# Patient Record
Sex: Female | Born: 1985 | Race: White | Hispanic: No | Marital: Single | Smoking: Former smoker
Health system: Southern US, Community
[De-identification: ages and names within clinical notes are randomized; demographics above are authoritative.]

## PROBLEM LIST (undated history)

## (undated) DIAGNOSIS — A749 Chlamydial infection, unspecified: Secondary | ICD-10-CM

## (undated) DIAGNOSIS — I10 Essential (primary) hypertension: Secondary | ICD-10-CM

## (undated) DIAGNOSIS — E785 Hyperlipidemia, unspecified: Secondary | ICD-10-CM

## (undated) DIAGNOSIS — R7303 Prediabetes: Secondary | ICD-10-CM

## (undated) DIAGNOSIS — O1490 Unspecified pre-eclampsia, unspecified trimester: Secondary | ICD-10-CM

## (undated) HISTORY — DX: Unspecified pre-eclampsia, unspecified trimester: O14.90

## (undated) HISTORY — DX: Chlamydial infection, unspecified: A74.9

## (undated) HISTORY — DX: Hyperlipidemia, unspecified: E78.5

## (undated) HISTORY — DX: Prediabetes: R73.03

## (undated) HISTORY — PX: FIBULA FRACTURE SURGERY: SHX947

---
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
## 2009-05-12 ENCOUNTER — Ambulatory Visit (HOSPITAL_BASED_OUTPATIENT_CLINIC_OR_DEPARTMENT_OTHER): Admission: RE | Admit: 2009-05-12 | Discharge: 2009-05-12 | Payer: Self-pay | Admitting: Orthopedic Surgery

## 2009-07-15 ENCOUNTER — Encounter (HOSPITAL_COMMUNITY): Admission: RE | Admit: 2009-07-15 | Discharge: 2009-08-14 | Payer: Self-pay | Admitting: Orthopedic Surgery

## 2009-08-16 ENCOUNTER — Encounter (HOSPITAL_COMMUNITY): Admission: RE | Admit: 2009-08-16 | Discharge: 2009-09-15 | Payer: Self-pay | Admitting: Orthopedic Surgery

## 2010-02-16 ENCOUNTER — Emergency Department (HOSPITAL_COMMUNITY): Admission: EM | Admit: 2010-02-16 | Discharge: 2010-02-16 | Payer: Self-pay | Admitting: Family Medicine

## 2010-07-17 LAB — POCT I-STAT, CHEM 8
Calcium, Ion: 1.2 mmol/L (ref 1.12–1.32)
Glucose, Bld: 104 mg/dL — ABNORMAL HIGH (ref 70–99)
HCT: 43 % (ref 36.0–46.0)
Hemoglobin: 14.6 g/dL (ref 12.0–15.0)

## 2010-09-16 NOTE — Discharge Summary (Signed)
NAME:  Susan Valdez, Susan Valdez                             ACCOUNT NO.:  [id number]   MEDICAL RECORD NO.:  [id number]                   PATIENT TYPE:  INP   LOCATION:  9119                                 FACILITY:  [facility]   PHYSICIAN:  Bradly Bienenstock, M.D.                   DATE OF BIRTH:  [birth year]   DATE OF ADMISSION:  06/24/2003  DATE OF DISCHARGE:  06/27/2003                                 DISCHARGE SUMMARY   DISCHARGE DIAGNOSES:  1. Intrauterine pregnancy at term - delivered female infant.  2. Status post low transverse cesarean section secondary to breech position.  3. Anemia.  Discharge hemoglobin 9.3.  4. Pregnancy induced hypertension.  5. Oligohydramnios.   FOLLOW UP:  The patient is to follow up in six weeks at [facility].   DISCHARGE MEDICATIONS:  1. Ibuprofen 600 mg one q.6h. p.r.n.  2. Percocet 5/325 one to two q.4h. p.r.n.  3. Iron 325 mg one daily.  4. Micronor oral contraceptive one daily.   PROCEDURE:  Transverse cesarean section secondary to frank breech performed  by Dr. Okey Dupre on June 24, 2003.   HISTORY OF PRESENT ILLNESS:  A 25 year old G1 who presented at 37-4/[redacted] weeks  gestation dated by third trimester ultrasound, presented with frank breech  presentation.  The patient had elevated blood pressures during this  pregnancy and was found to have oligohydramnios on presentation.  Therefore,  secondary to this, the patient was admitted for primary cesarean section.   OBSTETRICAL AND GYNECOLOGICAL HISTORY:  Negative.   PAST MEDICAL HISTORY:  None.   PAST SURGICAL HISTORY:  None.   MEDICATIONS:  Prenatal vitamins.   ALLERGIES:  1. PENICILLIN  2. STRAWBERRIES.   LABORATORY DATA ON ADMISSION:  Blood type A positive, antibody screen  negative.  Hemoglobin 19.8.  Platelets 279, rubella immune.  Hepatitis  negative.  Syphilis noncontributory.  HIV negative.  GC and Chlamydia  negative.  GBS negative on June 02, 2003.   PHYSICAL EXAMINATION:  GENERAL  APPEARANCE:  No acute distress.  VITAL SIGNS:  On presentation, temperature 98.9, pulse 81, respiratory rate  18, blood pressure 143/81.  The rest of the physical examination was within normal limits.  Fetal heart  rate was in the 130s with good variability, no decelerations.  The patient  was not having any contractions.   ASSESSMENT:  A 24 year old G1 with a frank breech with elevated blood  pressures and oligohydramnios believed secondary to preeclampsia.  She was  scheduled for a cesarean section.   HOSPITAL COURSE:  A cesarean section was performed on June 24, 2003.  For full operative report, please see that dictation.  She gave birth to a  female with Apgars 8 and 9.  The patient, after the cesarean section, had  routine postpartum care.  Blood pressures stayed in the normal range and she  did not exhibit any further signs of preeclampsia.  Therefore, on  postoperative day #3, se was discharged to home.  Hemoglobin on discharge  was 9.3.                                               Bradly Bienenstock, M.D.    Susan Valdez  D:  09/01/2003  T:  09/02/2003  Job:  [id number]

## 2010-09-16 NOTE — Op Note (Signed)
NAME:  Susan Valdez, Susan Valdez                             ACCOUNT NO.:  [id number]   MEDICAL RECORD NO.:  [id number]                   PATIENT TYPE:  INP   LOCATION:  9119                                 FACILITY:  [facility]   PHYSICIAN:  Phil D. Okey Dupre, M.D.                  DATE OF BIRTH:  December 14, 1985   DATE OF PROCEDURE:  06/24/2003  DATE OF DISCHARGE:                                 OPERATIVE REPORT   PROCEDURE:  Low transverse cesarean section.   INDICATIONS:  Homero Fellers breech.   SURGEON:  Javier Glazier. Okey Dupre, M.D.   FIRST ASSISTANT:  Bradly Bienenstock, M.D.   ESTIMATED BLOOD LOSS:  1.   Infant, a female with Apgars of 8 and 9, cord gas of 7.32.   PROCEDURE NOTE:  Informed consent for procedure was obtained from patient.  The patient had spinal anesthesia performed in the operating room and was  placed in the normal operating position.  A Foley catheter was inserted.  The patient's abdomen was prepped with a sterile iodine solution, and she  was draped in the normal fashion.  Adequate anesthesia was verified with  Allis clamps and the procedure was begun.  A Pfannenstiel skin incision was  made using a scalpel and subcutaneous tissue was sharply dissected down to  the fascia.  Fascia was incised with a scalpel and then sharply opened with  Mayo scissors laterally to the width of the skin incision.  The rectus  muscles were dissected away from the fascia, first bluntly and then sharply,  using Mayo scissors.  Rectus muscles were separated and then peritoneum was  identified and the peritoneum was identified using hemostats and was opened  sharply using Metzenbaum scissors.  Rectus and peritoneum were stretched  open bluntly.  A bladder blade was inserted and the uterus was located.  A  bladder flap was created sharply using Metzenbaum scissors.  Bladder flap  was dissected bluntly away from the vesicouterine peritoneum and bladder  blade was placed inside the bladder flap.  Uterus was opened sharply with  scalpel and then stretched open laterally.  The uterus was entered to  retrieve infant and infant was delivered in a frank breech position.  Infant  was bulb-suctioned at the peritoneum.  Infant was delivered at 12:02, a  female with Apgars 8 and 9, cord gas 7.32.  After infant was bulb-suctioned,  cord was clamped and cut and infant was handed to waiting NICU team.  Arterial blood gas was obtained as well as venous cord gas.  Placenta was  delivered immediately and delivered spontaneously.  The uterus was massaged  and then a dry lap was used to check the uterine cavity for any residual  placental tissue.  After this had been verified, the edges of the uterine  incision were identified with ring forceps, the bladder blade was  reinserted.  Chromic 0 suture was used in  a running locking fashion to close  the uterine incision.  The uterus was then irrigated with sterile saline  solution to verify good hemostasis.  After adequate hemostasis was verified,  fascial layer was closed in a running fashion with 0 chromic suture.  Subcutaneous tissues were irrigated and adequate hemostasis was verified and  the skin incision was closed with staples.  The patient was in stable  condition and doing well after procedure.    Bradly Bienenstock, M.D.                         Phil D. Okey Dupre, M.D.   Arliss Journey  D:  06/24/2003  T:  06/24/2003  Job:  [id number]

## 2010-09-26 ENCOUNTER — Emergency Department (HOSPITAL_COMMUNITY)
Admission: EM | Admit: 2010-09-26 | Discharge: 2010-09-26 | Disposition: A | Discharge status: Home / Self Care | Payer: Self-pay | Attending: Emergency Medicine | Admitting: Emergency Medicine

## 2010-09-26 ENCOUNTER — Emergency Department (HOSPITAL_COMMUNITY): Payer: Self-pay

## 2010-09-26 DIAGNOSIS — I10 Essential (primary) hypertension: Secondary | ICD-10-CM | POA: Insufficient documentation

## 2010-09-26 DIAGNOSIS — M25579 Pain in unspecified ankle and joints of unspecified foot: Secondary | ICD-10-CM | POA: Insufficient documentation

## 2012-03-11 ENCOUNTER — Emergency Department (HOSPITAL_COMMUNITY)
Admission: EM | Admit: 2012-03-11 | Discharge: 2012-03-11 | Disposition: A | Discharge status: Home / Self Care | Payer: Medicaid Other | Attending: Emergency Medicine | Admitting: Emergency Medicine

## 2012-03-11 ENCOUNTER — Encounter (HOSPITAL_COMMUNITY): Payer: Self-pay | Admitting: Emergency Medicine

## 2012-03-11 DIAGNOSIS — H5789 Other specified disorders of eye and adnexa: Secondary | ICD-10-CM | POA: Insufficient documentation

## 2012-03-11 DIAGNOSIS — I10 Essential (primary) hypertension: Secondary | ICD-10-CM | POA: Insufficient documentation

## 2012-03-11 DIAGNOSIS — H109 Unspecified conjunctivitis: Secondary | ICD-10-CM

## 2012-03-11 HISTORY — DX: Essential (primary) hypertension: I10

## 2012-03-11 MED ORDER — TOBRAMYCIN 0.3 % OP SOLN
2.0000 [drp] | OPHTHALMIC | Status: DC
  Administered 2012-03-11: 2 [drp] via OPHTHALMIC
  Filled 2012-03-11: qty 5

## 2012-03-11 MED ORDER — TOBRAMYCIN-DEXAMETHASONE 0.3-0.1 % OP SUSP: 1.0000 [drp] | OPHTHALMIC | Status: DC

## 2012-03-11 NOTE — ED Notes (Signed)
Cold like symptoms prior to getting symptoms in eyes. Drainage and redness noted bilaterally.

## 2012-03-11 NOTE — ED Provider Notes (Signed)
History   This chart was scribed for Donnetta Hutching, MD by Charolett Bumpers, ER Scribe. The patient was seen in room APA07/APA07. Patient's care was started at 0731.   CSN: [id number]  Arrival date & time 03/11/12  0711   First MD Initiated Contact with Patient 03/11/12 [phone]      Chief Complaint  Patient presents with  . Conjunctivitis    The history is provided by the patient. No language interpreter was used.  Susan Valdez is a 26 y.o. female who presents to the Emergency Department complaining of moderate bilateral eye redness with associated swelling for the past 2 days. She states that her symptoms started with the right eye 2 days ago that spread to left eye later that day. She reports associated cough, sneezing and sore throat that proceeded the eye symptoms. She reports relief with warm compresses. She denies any sick contacts. She is otherwise normally healthy.   Past Medical History  Diagnosis Date  . Hypertension     Past Surgical History  Procedure Date  . Fibula fracture surgery   . Cesarean section     History reviewed. No pertinent family history.  History  Substance Use Topics  . Smoking status: Never Smoker   . Smokeless tobacco: Not on file  . Alcohol Use: No    OB History    Grav Para Term Preterm Abortions TAB SAB Ect Mult Living                  Review of Systems A complete 10 system review of systems was obtained and all systems are negative except as noted in the HPI and PMH.   Allergies  Penicillins  Home Medications  No current outpatient prescriptions on file.  BP 129/87  Pulse 93  Temp 98.9 F (37.2 C) (Oral)  Resp 18  Ht 5\' 3"  (1.6 m)  Wt 165 lb (74.844 kg)  BMI 29.23 kg/m2  SpO2 100%  LMP 02/22/2012  Physical Exam  Nursing note and vitals reviewed. Constitutional: She is oriented to person, place, and time. She appears well-developed and well-nourished. No distress.  HENT:  Head: Normocephalic and atraumatic.  Right  Ear: External ear normal.  Left Ear: External ear normal.  Nose: Nose normal.  Mouth/Throat: Oropharynx is clear and moist. No oropharyngeal exudate.  Eyes: EOM are normal. Pupils are equal, round, and reactive to light. Right eye exhibits discharge. Left eye exhibits discharge. Right conjunctiva is injected. Left conjunctiva is injected.       Bilateral clear discharge.   Cardiovascular: Normal rate.   Pulmonary/Chest: Effort normal. No respiratory distress.  Musculoskeletal: Normal range of motion.  Neurological: She is alert and oriented to person, place, and time.  Skin: Skin is warm and dry.  Psychiatric: She has a normal mood and affect. Her behavior is normal.    ED Course  Procedures (including critical care time)  DIAGNOSTIC STUDIES: Oxygen Saturation is 100% on room air, normal by my interpretation.    COORDINATION OF CARE:  07:47-Discussed planned course of treatment with the patient including Tobramycin eye drops, who is agreeable at this time.   08:00-Medication Orders: Tobramycin (Tobrex) 0.3% ophthalmic solution 2 drop- 6 times per day.   Labs Reviewed - No data to display No results found.   No diagnosis found.    MDM  History and physical consistent with conjunctivitis. Rx tobramycin ophthalmic   I personally performed the services described in this documentation, which was scribed in my presence.  The recorded information has been reviewed and is accurate.       Donnetta Hutching, MD 03/11/12 [phone]

## 2012-03-15 ENCOUNTER — Encounter (HOSPITAL_COMMUNITY): Payer: Self-pay | Admitting: Emergency Medicine

## 2012-03-15 ENCOUNTER — Emergency Department (HOSPITAL_COMMUNITY)
Admission: EM | Admit: 2012-03-15 | Discharge: 2012-03-15 | Disposition: A | Discharge status: Home / Self Care | Payer: Medicaid Other | Attending: Emergency Medicine | Admitting: Emergency Medicine

## 2012-03-15 DIAGNOSIS — I10 Essential (primary) hypertension: Secondary | ICD-10-CM | POA: Insufficient documentation

## 2012-03-15 DIAGNOSIS — H5789 Other specified disorders of eye and adnexa: Secondary | ICD-10-CM | POA: Insufficient documentation

## 2012-03-15 DIAGNOSIS — H109 Unspecified conjunctivitis: Secondary | ICD-10-CM | POA: Insufficient documentation

## 2012-03-15 MED ORDER — SULFACETAMIDE SODIUM 10 % OP SOLN: 2.0000 [drp] | OPHTHALMIC | Status: DC

## 2012-03-15 NOTE — ED Notes (Signed)
Pt states was seen on Monday for same. Has used all of antibiotic drops and no better.

## 2012-03-15 NOTE — ED Provider Notes (Signed)
History   This chart was scribed for Susan Lyons, MD by Toya Smothers, ED Scribe. The patient was seen in room APA18/APA18. Patient's care was started at 0810.  CSN: [id number]  Arrival date & time 03/15/12  0810   First MD Initiated Contact with Patient 03/15/12 [phone]      Chief Complaint  Patient presents with  . Conjunctivitis    Patient is a 26 y.o. female presenting with conjunctivitis. The history is provided by the patient. No language interpreter was used.  Conjunctivitis  The current episode started 5 to 7 days ago. The onset was sudden. The problem occurs continuously. The problem has been unchanged. The problem is moderate. Nothing relieves the symptoms. Nothing aggravates the symptoms. Associated symptoms include eye discharge and eye redness. Pertinent negatives include no fever, no abdominal pain, no diarrhea, no nausea, no vomiting and no rash. The eye pain is moderate. Both eyes are affected.The eye pain is not associated with movement. The eyelid exhibits redness. She has been behaving normally. She has been eating and drinking normally. Urine output has been normal. There were no sick contacts. Recently, medical care has been given at this facility. Services received include medications given and tests performed.   Susan Valdez is a 26 y.o. female who presents to the Emergency Department complaining of 6 days of recurrent, constant, unchanged, moderate bilateral eye redness with associated yellowish discharge and itching. Pt was evaluated at [facility] ED 4 days ago, Dx with conjunctivitis and Rx with Tobradex eye drop solution. Pt reports no improvement or relief despite the use of RX. No fever, chills, cough, congestion, rhinorrhea, chest pain, SOB, or n/v/d. Medical Hx includes HTN.     Past Medical History  Diagnosis Date  . Hypertension     Past Surgical History  Procedure Date  . Fibula fracture surgery   . Cesarean section     History reviewed. No pertinent  family history.  History  Substance Use Topics  . Smoking status: Never Smoker   . Smokeless tobacco: Not on file  . Alcohol Use: No    Review of Systems  Constitutional: Negative for fever.  Eyes: Positive for discharge and redness.  Gastrointestinal: Negative for nausea, vomiting, abdominal pain and diarrhea.  Skin: Negative for rash.  All other systems reviewed and are negative.    Allergies  Penicillins  Home Medications   Current Outpatient Rx  Name  Route  Sig  Dispense  Refill  . TOBRAMYCIN-DEXAMETHASONE 0.3-0.1 % OP SUSP   Both Eyes   Place 1 drop into both eyes every 4 (four) hours while awake.   10 mL   0     BP 124/77  Pulse 99  Temp 98.7 F (37.1 C) (Oral)  Resp 18  Ht 5\' 3"  (1.6 m)  Wt 165 lb (74.844 kg)  BMI 29.23 kg/m2  SpO2 98%  LMP 02/22/2012  Physical Exam  Nursing note and vitals reviewed. Constitutional: She is oriented to person, place, and time. She appears well-developed and well-nourished. No distress.  HENT:  Head: Normocephalic.  Mouth/Throat: No oropharyngeal exudate.  Eyes: EOM are normal. Pupils are equal, round, and reactive to light.       Moderate injection of the conjunctiva bilaterally with purulent drainage. Crusting in the eye-lashes  Neck: Normal range of motion. No tracheal deviation present.  Cardiovascular: Normal rate, regular rhythm and normal heart sounds.   No murmur heard. Pulmonary/Chest: Effort normal and breath sounds normal. No respiratory distress. She has  no wheezes.  Musculoskeletal: Normal range of motion. She exhibits no edema.  Lymphadenopathy:    She has no cervical adenopathy.  Neurological: She is alert and oriented to person, place, and time. Coordination normal.  Skin: Skin is warm and dry. No rash noted. She is not diaphoretic.  Psychiatric: Her behavior is normal.    ED Course  Procedures DIAGNOSTIC STUDIES: Oxygen Saturation is 98% on room air, normal by my interpretation.     COORDINATION OF CARE: 08:30- Evaluated Pt. Pt is awake, alert, and without distress. 08:35- Patient  understand and agree with initial ED impression and plan with expectations set for ED visit.   Labs Reviewed - No data to display No results found.   No diagnosis found.    MDM  Not improving with meds given.  Will change to Bleph 10.  Return prn.      I personally performed the services described in this documentation, which was scribed in my presence. The recorded information has been reviewed and is accurate.      Susan Lyons, MD 03/15/12 1540

## 2012-09-07 ENCOUNTER — Encounter (HOSPITAL_COMMUNITY): Payer: Self-pay | Admitting: *Deleted

## 2012-09-07 ENCOUNTER — Emergency Department (HOSPITAL_COMMUNITY): Payer: Self-pay

## 2012-09-07 ENCOUNTER — Emergency Department (HOSPITAL_COMMUNITY)
Admission: EM | Admit: 2012-09-07 | Discharge: 2012-09-07 | Disposition: A | Discharge status: Home / Self Care | Payer: Self-pay | Attending: Emergency Medicine | Admitting: Emergency Medicine

## 2012-09-07 DIAGNOSIS — Z79899 Other long term (current) drug therapy: Secondary | ICD-10-CM | POA: Insufficient documentation

## 2012-09-07 DIAGNOSIS — S90129A Contusion of unspecified lesser toe(s) without damage to nail, initial encounter: Secondary | ICD-10-CM | POA: Insufficient documentation

## 2012-09-07 DIAGNOSIS — Y939 Activity, unspecified: Secondary | ICD-10-CM | POA: Insufficient documentation

## 2012-09-07 DIAGNOSIS — I10 Essential (primary) hypertension: Secondary | ICD-10-CM | POA: Insufficient documentation

## 2012-09-07 DIAGNOSIS — S90121A Contusion of right lesser toe(s) without damage to nail, initial encounter: Secondary | ICD-10-CM

## 2012-09-07 DIAGNOSIS — W010XXA Fall on same level from slipping, tripping and stumbling without subsequent striking against object, initial encounter: Secondary | ICD-10-CM | POA: Insufficient documentation

## 2012-09-07 DIAGNOSIS — Z880 Allergy status to penicillin: Secondary | ICD-10-CM | POA: Insufficient documentation

## 2012-09-07 DIAGNOSIS — IMO0002 Reserved for concepts with insufficient information to code with codable children: Secondary | ICD-10-CM | POA: Insufficient documentation

## 2012-09-07 DIAGNOSIS — Y929 Unspecified place or not applicable: Secondary | ICD-10-CM | POA: Insufficient documentation

## 2012-09-07 MED ORDER — IBUPROFEN 800 MG PO TABS
800.0000 mg | ORAL_TABLET | Freq: Once | ORAL | Status: AC
  Administered 2012-09-07: 800 mg via ORAL
  Filled 2012-09-07: qty 1

## 2012-09-07 MED ORDER — IBUPROFEN 600 MG PO TABS: 600.0000 mg | ORAL_TABLET | Freq: Four times a day (QID) | ORAL | Status: DC | PRN

## 2012-09-07 NOTE — ED Provider Notes (Signed)
Medical screening examination/treatment/procedure(s) were performed by non-physician practitioner and as supervising physician I was immediately available for consultation/collaboration.  Gilda Crease, MD 09/07/12 [phone]

## 2012-09-07 NOTE — ED Provider Notes (Signed)
History     CSN: [id number]  Arrival date & time 09/07/12  1527   First MD Initiated Contact with Patient 09/07/12 1603      Chief Complaint  Patient presents with  . Toe Pain    (Consider location/radiation/quality/duration/timing/severity/associated sxs/prior treatment) HPI Comments: Susan Valdez is a 27 y.o. Female with right great toe pain after stubbing while tripping and falling this morning.  She reports constant aching pain which does not radiate.  She denies any other injury.  She has used an ice pack for relief of pain.  Pain is worsened with movement, weight bearing and palpation.     The history is provided by the patient.    Past Medical History  Diagnosis Date  . Hypertension     Past Surgical History  Procedure Laterality Date  . Fibula fracture surgery    . Cesarean section      No family history on file.  History  Substance Use Topics  . Smoking status: Never Smoker   . Smokeless tobacco: Not on file  . Alcohol Use: No    OB History   Grav Para Term Preterm Abortions TAB SAB Ect Mult Living                  Review of Systems  Constitutional: Negative for fever.  Musculoskeletal: Positive for arthralgias. Negative for myalgias and joint swelling.  Neurological: Negative for weakness and numbness.    Allergies  Penicillins  Home Medications   Current Outpatient Rx  Name  Route  Sig  Dispense  Refill  . hydrochlorothiazide (HYDRODIURIL) 25 MG tablet   Oral   Take 25 mg by mouth daily.         Marland Kitchen ibuprofen (ADVIL,MOTRIN) 600 MG tablet   Oral   Take 1 tablet (600 mg total) by mouth every 6 (six) hours as needed for pain.   30 tablet   0     BP 135/91  Pulse 102  Temp(Src) 97.9 F (36.6 C) (Oral)  Resp 16  Ht 5\' 3"  (1.6 m)  Wt 180 lb (81.647 kg)  BMI 31.89 kg/m2  SpO2 100%  LMP 08/31/2012  Physical Exam  Constitutional: She appears well-developed and well-nourished.  HENT:  Head: Atraumatic.  Neck: Normal range of  motion.  Cardiovascular:  Pulses equal bilaterally  Musculoskeletal: She exhibits tenderness.       Right foot: She exhibits bony tenderness. She exhibits normal capillary refill and no deformity.  ttp dorsal proximal phalanx of right great toe.  Mild erythema and edema. Cap refill less than 3 sec.  No deformity.  Distal sensation intact.  Neurological: She is alert. She has normal strength. She displays normal reflexes. No sensory deficit.  Equal strength  Skin: Skin is warm and dry.  Psychiatric: She has a normal mood and affect.    ED Course  Procedures (including critical care time)  Labs Reviewed - No data to display Dg Toe Great Right  09/07/2012  *RADIOLOGY REPORT*  Clinical Data: Fall.  Great toe pain.  RIGHT GREAT TOE  Comparison: None.  Findings: Right great toe appears within normal limits.  There is anatomic alignment.  No fracture.  No radiopaque foreign body.  IMPRESSION: Negative.   Original Report Authenticated By: Andreas Newport, M.D.      1. Toe contusion, right, initial encounter       MDM  Patients labs and/or radiological studies were viewed and considered during the medical decision making and disposition process.  Pt supplied post op shoe for comfort.  Encouraged continued ice and elevation,  Ibuprofen prn pain.          Burgess Amor, PA-C 09/07/12 1629

## 2012-09-07 NOTE — ED Notes (Signed)
Pain to right great toe. Began after tripping and falling on concrete.

## 2014-12-31 LAB — HM PAP SMEAR: HM Pap smear: NEGATIVE

## 2015-01-26 ENCOUNTER — Encounter: Payer: Self-pay | Admitting: *Deleted

## 2015-02-02 ENCOUNTER — Encounter: Payer: Self-pay | Admitting: Obstetrics & Gynecology

## 2015-08-06 ENCOUNTER — Encounter (HOSPITAL_COMMUNITY): Payer: Self-pay | Admitting: Emergency Medicine

## 2015-08-06 ENCOUNTER — Emergency Department (HOSPITAL_COMMUNITY)
Admission: EM | Admit: 2015-08-06 | Discharge: 2015-08-06 | Disposition: A | Discharge status: Home / Self Care | Payer: Medicaid Other | Attending: Emergency Medicine | Admitting: Emergency Medicine

## 2015-08-06 DIAGNOSIS — I10 Essential (primary) hypertension: Secondary | ICD-10-CM | POA: Insufficient documentation

## 2015-08-06 DIAGNOSIS — J029 Acute pharyngitis, unspecified: Secondary | ICD-10-CM | POA: Insufficient documentation

## 2015-08-06 DIAGNOSIS — R52 Pain, unspecified: Secondary | ICD-10-CM | POA: Insufficient documentation

## 2015-08-06 LAB — RAPID STREP SCREEN (MED CTR MEBANE ONLY): Streptococcus, Group A Screen (Direct): NEGATIVE

## 2015-08-06 NOTE — Discharge Instructions (Signed)
Take over the counter tylenol and ibuprofen, as directed on packaging, as needed for discomfort.  Gargle with warm water several times per day to help with discomfort.  May also use over the counter sore throat pain medicines such as chloraseptic or sucrets, as directed on packaging, as needed for discomfort.  Call your regular medical doctor today to schedule a follow up appointment in the next 3 days.  Return to the Emergency Department immediately if worsening.

## 2015-08-06 NOTE — ED Notes (Signed)
PT c/o sorethroat, bilateral ear pain, and body aches x4 days.

## 2015-08-06 NOTE — ED Provider Notes (Signed)
CSN: [id number]     Arrival date & time 08/06/15  0746 History   First MD Initiated Contact with Patient 08/06/15 [phone]     Chief Complaint  Patient presents with  . Sore Throat      HPI  Pt was seen at 0800.  Per pt, c/o gradual onset and persistence of constant sore throat, bilat ears "pain" and generalized body aches for the past 3 days.  Denies fevers, no rash, no CP/SOB, no N/V/D, no abd pain.    Past Medical History  Diagnosis Date  . Hypertension    Past Surgical History  Procedure Laterality Date  . Fibula fracture surgery    . Cesarean section      Social History  Substance Use Topics  . Smoking status: Never Smoker   . Smokeless tobacco: None  . Alcohol Use: No   OB History    Gravida Para Term Preterm AB TAB SAB Ectopic Multiple Living   1 1        1      Review of Systems ROS: Statement: All systems negative except as marked or noted in the HPI; Constitutional: Negative for fever and chills. +generalized body aches.; ; Eyes: Negative for eye pain, redness and discharge. ; ; ENMT: Negative for hoarseness, nasal congestion, sinus pressure and +sore throat, bilat ears pain. ; ; Cardiovascular: Negative for chest pain, palpitations, diaphoresis, dyspnea and peripheral edema. ; ; Respiratory: Negative for cough, wheezing and stridor. ; ; Gastrointestinal: Negative for nausea, vomiting, diarrhea, abdominal pain, blood in stool, hematemesis, jaundice and rectal bleeding. . ; ; Genitourinary: Negative for dysuria, flank pain and hematuria. ; ; Musculoskeletal: Negative for back pain and neck pain. Negative for swelling and trauma.; ; Skin: Negative for pruritus, rash, abrasions, blisters, bruising and skin lesion.; ; Neuro: Negative for headache, lightheadedness and neck stiffness. Negative for weakness, altered level of consciousness , altered mental status, extremity weakness, paresthesias, involuntary movement, seizure and syncope.       Allergies  Penicillins  Home  Medications   Prior to Admission medications   Medication Sig Start Date End Date Taking? Authorizing Provider  hydrochlorothiazide (HYDRODIURIL) 25 MG tablet Take 25 mg by mouth daily.    Historical Provider, MD  ibuprofen (ADVIL,MOTRIN) 600 MG tablet Take 1 tablet (600 mg total) by mouth every 6 (six) hours as needed for pain. 09/07/12   Burgess AmorJulie Idol, PA-C  norethindrone (MICRONOR,CAMILA,ERRIN) 0.35 MG tablet Take 1 tablet by mouth daily.    Historical Provider, MD   BP 146/89 mmHg  Pulse 95  Temp(Src) 97.8 F (36.6 C) (Oral)  Resp 18  Ht 5\' 4"  (1.626 m)  Wt 174 lb (78.926 kg)  BMI 29.85 kg/m2  SpO2 96%  LMP 07/10/2015 Physical Exam  0805: Physical examination:  Nursing notes reviewed; Vital signs and O2 SAT reviewed;  Constitutional: Well developed, Well nourished, Well hydrated, In no acute distress; Head:  Normocephalic, atraumatic; Eyes: EOMI, PERRL, No scleral icterus; ENMT: TM's clear bilat. +edemetous nasal turbinates bilat with clear rhinorrhea. Mouth and pharynx without lesions. +mild posterior pharyngeal erythema. No tonsillar exudates. No intra-oral edema. No submandibular or sublingual edema. No hoarse voice, no drooling, no stridor. No pain with manipulation of larynx. No trismus. Mouth and pharynx normal, Mucous membranes moist; Neck: Supple, Full range of motion, No lymphadenopathy; Cardiovascular: Regular rate and rhythm, No murmur, rub, or gallop; Respiratory: Breath sounds clear & equal bilaterally, No rales, rhonchi, wheezes.  Speaking full sentences with ease, Normal respiratory effort/excursion; Chest:  Nontender, Movement normal; Abdomen: Soft, Nontender, Nondistended, Normal bowel sounds; Genitourinary: No CVA tenderness; Extremities: Pulses normal, No tenderness, No edema, No calf edema or asymmetry.; Neuro: AA&Ox3, Major CN grossly intact.  Speech clear. No gross focal motor or sensory deficits in extremities.; Skin: Color normal, Warm, Dry.   ED Course  Procedures  (including critical care time) Labs Review  Imaging Review  I have personally reviewed and evaluated these images and lab results as part of my medical decision-making.   EKG Interpretation None      MDM  MDM Reviewed: previous chart, nursing note and vitals Interpretation: labs     Results for orders placed or performed during the hospital encounter of 08/06/15  Rapid strep screen  Result Value Ref Range   Streptococcus, Group A Screen (Direct) NEGATIVE NEGATIVE    0845:  Pt states she "came here to just see if I had strep." Strep negative; pt reassured. Pt wants to go home now. Tx symptomatically. Dx and testing d/w pt.  Questions answered.  Verb understanding, agreeable to d/c home with outpt f/u.     Samuel Jester, DO 08/09/15 [phone]

## 2015-08-08 LAB — CULTURE, GROUP A STREP (THRC)

## 2016-02-28 ENCOUNTER — Encounter: Payer: Self-pay | Admitting: *Deleted

## 2016-02-29 ENCOUNTER — Ambulatory Visit (INDEPENDENT_AMBULATORY_CARE_PROVIDER_SITE_OTHER): Payer: Medicaid Other | Admitting: Obstetrics & Gynecology

## 2016-02-29 ENCOUNTER — Encounter: Payer: Self-pay | Admitting: Obstetrics & Gynecology

## 2016-02-29 VITALS — BP 120/90 | HR 88 | Ht 63.0 in | Wt 178.0 lb

## 2016-02-29 DIAGNOSIS — Z3009 Encounter for other general counseling and advice on contraception: Secondary | ICD-10-CM | POA: Diagnosis not present

## 2016-02-29 NOTE — Progress Notes (Signed)
Chief Complaint  Patient presents with  . Referral    discuss possible surgery/ BTL    Blood pressure 120/90, pulse 88, height 5\' 3"  (1.6 m), weight 178 lb (80.7 kg), last menstrual period 02/25/2016.  30 y.o. G1P1 Patient's last menstrual period was 02/25/2016. The current method of family planning is oral progesterone-only contraceptive.  Outpatient Encounter Prescriptions as of 02/29/2016  Medication Sig  . hydrochlorothiazide (HYDRODIURIL) 25 MG tablet Take 25 mg by mouth daily.  . norethindrone (MICRONOR,CAMILA,ERRIN) 0.35 MG tablet Take 1 tablet by mouth daily.  . metroNIDAZOLE (FLAGYL) 500 MG tablet Take 500 mg by mouth 2 (two) times daily.  . [DISCONTINUED] fluconazole (DIFLUCAN) 150 MG tablet Take 150 mg by mouth as directed.  . [DISCONTINUED] nystatin cream (MYCOSTATIN) Apply 1 application topically 2 (two) times daily as needed for dry skin.  . [DISCONTINUED] Omega-3 Fatty Acids (FISH OIL) 1000 MG CAPS Take by mouth 2 (two) times daily.   No facility-administered encounter medications on file as of 02/29/2016.     Subjective Pt G1P1 Patient's last menstrual period was 02/25/2016. on micronor desires permanent sterilization We discussed mirena nexplanon depo provera OCP She definitely wants permanent sterilization In a new relationship he has 1 child   Objective   Pertinent ROS   Labs or studies     Impression Diagnoses this Encounter::   ICD-9-CM ICD-10-CM   1. Sterilization consult V25.09 Z30.09     Established relevant diagnosis(es):   Plan/Recommendations: No orders of the defined types were placed in this encounter.   Labs or Scans Ordered: No orders of the defined types were placed in this encounter.   Management:: Sign BTL papers today, return in 1 month to schedule  Follow up Return in about 4 weeks (around 03/28/2016) for pre op , with Dr Despina HiddenEure.        Face to face time:  20 minutes  Greater than 50% of the visit time  was spent in counseling and coordination of care with the patient.  The summary and outline of the counseling and care coordination is summarized in the note above.   All questions were answered.  Past Medical History:  Diagnosis Date  . Chlamydia   . Hypertension   . Increased serum lipids   . Pre-diabetes   . Toxemia in pregnancy    resolved    Past Surgical History:  Procedure Laterality Date  . CESAREAN SECTION    . FIBULA FRACTURE SURGERY      OB History    Gravida Para Term Preterm AB Living   1 1       1    SAB TAB Ectopic Multiple Live Births                  Allergies  Allergen Reactions  . Penicillins     Social History   Social History  . Marital status: Single    Spouse name: N/A  . Number of children: N/A  . Years of education: N/A   Social History Main Topics  . Smoking status: Never Smoker  . Smokeless tobacco: Never Used  . Alcohol use Yes     Comment: once a month  . Drug use: No  . Sexual activity: Not Asked   Other Topics Concern  . None   Social History Narrative  . None    Family History  Problem Relation Age of Onset  . Hypertension Mother   . Diabetes Mother   . Gestational  diabetes Other

## 2016-03-28 ENCOUNTER — Encounter: Payer: Self-pay | Admitting: Obstetrics & Gynecology

## 2016-03-28 ENCOUNTER — Ambulatory Visit (INDEPENDENT_AMBULATORY_CARE_PROVIDER_SITE_OTHER): Payer: Medicaid Other | Admitting: Obstetrics & Gynecology

## 2016-03-28 VITALS — BP 134/88 | HR 85 | Ht 62.0 in | Wt 175.6 lb

## 2016-03-28 DIAGNOSIS — Z308 Encounter for other contraceptive management: Secondary | ICD-10-CM | POA: Diagnosis not present

## 2016-03-28 DIAGNOSIS — Z3009 Encounter for other general counseling and advice on contraception: Secondary | ICD-10-CM | POA: Diagnosis not present

## 2016-03-28 DIAGNOSIS — Z01818 Encounter for other preprocedural examination: Secondary | ICD-10-CM

## 2016-03-28 NOTE — Progress Notes (Signed)
G1P1  Preoperative History and Physical  Susan Valdez is a 30 y.o. G1P1 with Patient's last menstrual period was 02/25/2016. admitted for a laparoscopic bilateral salpingectomy for sterilization.  Discussed at length and had pt wait for 1 month, she definitely wants no more kids, choosed salpingectomy bilaterally for ovarian cancer prophylaxis  PMH:    Past Medical History:  Diagnosis Date  . Chlamydia   . Hypertension   . Increased serum lipids   . Pre-diabetes   . Toxemia in pregnancy    resolved    PSH:     Past Surgical History:  Procedure Laterality Date  . CESAREAN SECTION    . FIBULA FRACTURE SURGERY      POb/GynH:      OB History    Gravida Para Term Preterm AB Living   1 1       1    SAB TAB Ectopic Multiple Live Births                  SH:   Social History  Substance Use Topics  . Smoking status: Never Smoker  . Smokeless tobacco: Never Used  . Alcohol use Yes     Comment: once a month    FH:    Family History  Problem Relation Age of Onset  . Hypertension Mother   . Diabetes Mother   . Gestational diabetes Other      Allergies:  Allergies  Allergen Reactions  . Penicillins     Medications:       Current Outpatient Prescriptions:  .  hydrochlorothiazide (HYDRODIURIL) 25 MG tablet, Take 25 mg by mouth daily., Disp: , Rfl:  .  norethindrone (MICRONOR,CAMILA,ERRIN) 0.35 MG tablet, Take 1 tablet by mouth daily., Disp: , Rfl:   Review of Systems:   Review of Systems  Constitutional: Negative for fever, chills, weight loss, malaise/fatigue and diaphoresis.  HENT: Negative for hearing loss, ear pain, nosebleeds, congestion, sore throat, neck pain, tinnitus and ear discharge.   Eyes: Negative for blurred vision, double vision, photophobia, pain, discharge and redness.  Respiratory: Negative for cough, hemoptysis, sputum production, shortness of breath, wheezing and stridor.   Cardiovascular: Negative for chest pain, palpitations, orthopnea,  claudication, leg swelling and PND.  Gastrointestinal: Positive for abdominal pain. Negative for heartburn, nausea, vomiting, diarrhea, constipation, blood in stool and melena.  Genitourinary: Negative for dysuria, urgency, frequency, hematuria and flank pain.  Musculoskeletal: Negative for myalgias, back pain, joint pain and falls.  Skin: Negative for itching and rash.  Neurological: Negative for dizziness, tingling, tremors, sensory change, speech change, focal weakness, seizures, loss of consciousness, weakness and headaches.  Endo/Heme/Allergies: Negative for environmental allergies and polydipsia. Does not bruise/bleed easily.  Psychiatric/Behavioral: Negative for depression, suicidal ideas, hallucinations, memory loss and substance abuse. The patient is not nervous/anxious and does not have insomnia.      PHYSICAL EXAM:  Blood pressure 134/88, pulse 85, height 5\' 2"  (1.575 m), weight 175 lb 9.6 oz (79.7 kg), last menstrual period 02/25/2016.    Vitals reviewed. Constitutional: She is oriented to person, place, and time. She appears well-developed and well-nourished.  HENT:  Head: Normocephalic and atraumatic.  Right Ear: External ear normal.  Left Ear: External ear normal.  Nose: Nose normal.  Mouth/Throat: Oropharynx is clear and moist.  Eyes: Conjunctivae and EOM are normal. Pupils are equal, round, and reactive to light. Right eye exhibits no discharge. Left eye exhibits no discharge. No scleral icterus.  Neck: Normal range of motion. Neck supple.  No tracheal deviation present. No thyromegaly present.  Cardiovascular: Normal rate, regular rhythm, normal heart sounds and intact distal pulses.  Exam reveals no gallop and no friction rub.   No murmur heard. Respiratory: Effort normal and breath sounds normal. No respiratory distress. She has no wheezes. She has no rales. She exhibits no tenderness.  GI: Soft. Bowel sounds are normal. She exhibits no distension and no mass. There is  tenderness. There is no rebound and no guarding.  Genitourinary:       Vulva is normal without lesions Vagina is pink moist without discharge Cervix normal in appearance and pap is normal Uterus is normal size, contour, position, consistency, mobility, non-tender Adnexa is negative with normal sized ovaries by sonogram  Musculoskeletal: Normal range of motion. She exhibits no edema and no tenderness.  Neurological: She is alert and oriented to person, place, and time. She has normal reflexes. She displays normal reflexes. No cranial nerve deficit. She exhibits normal muscle tone. Coordination normal.  Skin: Skin is warm and dry. No rash noted. No erythema. No pallor.  Psychiatric: She has a normal mood and affect. Her behavior is normal. Judgment and thought content normal.    Labs: No results found for this or any previous visit (from the past 336 hour(s)).  EKG: No orders found for this or any previous visit.  Imaging Studies: No results found.    Assessment: Parous female desires permanent sterilization  Plan: Laparoscopic bilateral salpingectomy for sterilization 02/11/2016  EURE,LUTHER H 03/28/2016 5:05 PM

## 2016-04-04 NOTE — Patient Instructions (Signed)
Susan Valdez  04/04/2016     @PREFPERIOPPHARMACY @   Your procedure is scheduled on 04/12/2016.  Report to [facility] at 8:20 A.M.  Call this number if you have problems the morning of surgery:  [phone]   Remember:  Do not eat food or drink liquids after midnight.  Take these medicines the morning of surgery with A SIP OF WATER None   Do not wear jewelry, make-up or nail polish.  Do not wear lotions, powders, or perfumes, or deoderant.  Do not shave 48 hours prior to surgery.  Men may shave face and neck.  Do not bring valuables to the hospital.  [facility] is not responsible for any belongings or valuables.  Contacts, dentures or bridgework may not be worn into surgery.  Leave your suitcase in the car.  After surgery it may be brought to your room.  For patients admitted to the hospital, discharge time will be determined by your treatment team.  Patients discharged the day of surgery will not be allowed to drive home.    Please read over the following fact sheets that you were given. Surgical Site Infection Prevention and Anesthesia Post-op Instructions     PATIENT INSTRUCTIONS POST-ANESTHESIA  IMMEDIATELY FOLLOWING SURGERY:  Do not drive or operate machinery for the first twenty four hours after surgery.  Do not make any important decisions for twenty four hours after surgery or while taking narcotic pain medications or sedatives.  If you develop intractable nausea and vomiting or a severe headache please notify your doctor immediately.  FOLLOW-UP:  Please make an appointment with your surgeon as instructed. You do not need to follow up with anesthesia unless specifically instructed to do so.  WOUND CARE INSTRUCTIONS (if applicable):  Keep a dry clean dressing on the anesthesia/puncture wound site if there is drainage.  Once the wound has quit draining you may leave it open to air.  Generally you should leave the bandage intact for twenty four hours unless there is  drainage.  If the epidural site drains for more than 36-48 hours please call the anesthesia department.  QUESTIONS?:  Please feel free to call your physician or the hospital operator if you have any questions, and they will be happy to assist you.      Salpingectomy Salpingectomy, also called tubectomy, is the surgical removal of one of the fallopian tubes. The fallopian tubes are where eggs travel from the ovaries to the uterus. Removing one fallopian tube does not prevent you from becoming pregnant. It also does not cause problems with your menstrual periods. You may need a salpingectomy if you:  Have a fertilized egg that attaches to the fallopian tube (ectopic pregnancy), especially one that causes the tube to burst or tear (rupture).  Have an infected fallopian tube.  Have cancer of the fallopian tube or nearby organs.  Have had an ovary removed due to a cyst or tumor.  Have had your uterus removed. There are three different methods that can be used for a salpingectomy:  Open. This method involves making one large incision in your abdomen.  Laparoscopic. This method involves using a thin, lighted tube with a tiny camera on the end (laparoscope) to help perform the procedure. The laparoscope will allow your surgeon to make several small incisions in the abdomen instead of a large incision.  Robot-assisted: This method involves using a computer to control surgical instruments that are attached to robotic arms. Tell a health care provider about:  Any allergies you have.  All medicines you are taking, including vitamins, herbs, eye drops, creams, and over-the-counter medicines.  Any problems you or family members have had with anesthetic medicines.  Any blood disorders you have.  Any surgeries you have had.  Any medical conditions you have.  Whether you are pregnant or may be pregnant. What are the risks? Generally, this is a safe procedure. However, problems may occur,  including:  Infection.  Bleeding.  Allergic reactions to medicines.  Damage to other structures or organs.  Blood clots in the legs or lungs. What happens before the procedure? Staying hydrated  Follow instructions from your health care provider about hydration, which may include:  Up to 2 hours before the procedure - you may continue to drink clear liquids, such as water, clear fruit juice, black coffee, and plain tea. Eating and drinking restrictions  Follow instructions from your health care provider about eating and drinking, which may include:  8 hours before the procedure - stop eating heavy meals or foods such as meat, fried foods, or fatty foods.  6 hours before the procedure - stop eating light meals or foods, such as toast or cereal.  6 hours before the procedure - stop drinking milk or drinks that contain milk.  2 hours before the procedure - stop drinking clear liquids. Medicines  Ask your health care provider about:  Changing or stopping your regular medicines. This is especially important if you are taking diabetes medicines or blood thinners.  Taking medicines such as aspirin and ibuprofen. These medicines can thin your blood. Do not take these medicines before your procedure if your health care provider instructs you not to.  You may be given antibiotic medicine to help prevent infection. General instructions  Do not smoke for at least 2 weeks before your procedure. If you need help quitting, ask your health care provider.  You may have an exam or tests, such as an electrocardiogram (ECG).  You may have a blood or urine sample taken.  Ask your health care provider:  Whether you should stop removing hair from your surgical area.  How your surgical site will be marked or identified.  You may be asked to shower with a germ-killing soap.  Plan to have someone take you home from the hospital or clinic.  If you will be going home right after the  procedure, plan to have someone with you for 24 hours. What happens during the procedure?  To reduce your risk of infection:  Your health care team will wash or sanitize their hands.  Hair may be removed from the surgical area.  Your skin will be washed with soap.  An IV tube will be inserted into one of your veins.  You will be given a medicine to make you fall asleep (general anesthetic). You may also be given a medicine to help you relax (sedative).  A thin tube (catheter) may be inserted through your urethra and into your bladder to drain urine during your procedure.  Depending on the type of procedure you are having, one incision or several small incisions will be made in your abdomen.  Your fallopian tube will be cut and removed from where it attaches to your uterus.  Your blood vessels will be clamped and tied to prevent excess bleeding.  The incision(s) in your abdomen will be closed with stitches (sutures), staples, or skin glue.  A bandage (dressing) may be placed over your incision(s). The procedure may vary among health  care providers and hospitals. What happens after the procedure?  Your blood pressure, heart rate, breathing rate, and blood oxygen level will be monitored until the medicines you were given have worn off.  You may continue to receive fluids and medicines through an IV tube.  You may continue to have a catheter draining your urine.  You may have to wear compression stockings. These stockings help to prevent blood clots and reduce swelling in your legs.  You will be given pain medicine as needed.  Do not drive for 24 hours if you received a sedative. Summary  Salpingectomy is a surgical procedure to remove one of the fallopian tubes.  The procedure may be done with an open incision, with a laparoscope, or with computer-controlled instruments.  Depending on the type of procedure you are having, one incision or several small incisions will be made  in your abdomen.  Your blood pressure, heart rate, breathing rate, and blood oxygen level will be monitored until the medicines you were given have worn off.  Plan to have someone take you home from the hospital or clinic. This information is not intended to replace advice given to you by your health care provider. Make sure you discuss any questions you have with your health care provider. Document Released: 09/03/2008 Document Revised: 12/03/2015 Document Reviewed: 10/09/2012 Elsevier Interactive Patient Education  2017 [employer]

## 2016-04-05 ENCOUNTER — Encounter (HOSPITAL_COMMUNITY)
Admission: RE | Admit: 2016-04-05 | Discharge: 2016-04-05 | Disposition: A | Discharge status: Home / Self Care | Payer: Medicaid Other | Source: Ambulatory Visit | Attending: Obstetrics & Gynecology | Admitting: Obstetrics & Gynecology

## 2016-04-05 ENCOUNTER — Encounter (HOSPITAL_COMMUNITY): Payer: Self-pay

## 2016-04-05 DIAGNOSIS — Z302 Encounter for sterilization: Secondary | ICD-10-CM | POA: Insufficient documentation

## 2016-04-05 DIAGNOSIS — Z01812 Encounter for preprocedural laboratory examination: Secondary | ICD-10-CM | POA: Insufficient documentation

## 2016-04-05 LAB — URINALYSIS, ROUTINE W REFLEX MICROSCOPIC
BILIRUBIN URINE: NEGATIVE
Glucose, UA: NEGATIVE mg/dL
HGB URINE DIPSTICK: NEGATIVE
KETONES UR: NEGATIVE mg/dL
Leukocytes, UA: NEGATIVE
NITRITE: NEGATIVE
PROTEIN: NEGATIVE mg/dL
Specific Gravity, Urine: 1.016 (ref 1.005–1.030)
pH: 6 (ref 5.0–8.0)

## 2016-04-05 LAB — COMPREHENSIVE METABOLIC PANEL
ALK PHOS: 53 U/L (ref 38–126)
ALT: 17 U/L (ref 14–54)
ANION GAP: 9 (ref 5–15)
AST: 19 U/L (ref 15–41)
Albumin: 4.3 g/dL (ref 3.5–5.0)
BILIRUBIN TOTAL: 0.3 mg/dL (ref 0.3–1.2)
BUN: 9 mg/dL (ref 6–20)
CALCIUM: 9.7 mg/dL (ref 8.9–10.3)
CO2: 28 mmol/L (ref 22–32)
Chloride: 99 mmol/L — ABNORMAL LOW (ref 101–111)
Creatinine, Ser: 0.58 mg/dL (ref 0.44–1.00)
Glucose, Bld: 131 mg/dL — ABNORMAL HIGH (ref 65–99)
Potassium: 3.2 mmol/L — ABNORMAL LOW (ref 3.5–5.1)
SODIUM: 136 mmol/L (ref 135–145)
TOTAL PROTEIN: 7.4 g/dL (ref 6.5–8.1)

## 2016-04-05 LAB — HCG, QUANTITATIVE, PREGNANCY

## 2016-04-05 LAB — CBC
HCT: 41.5 % (ref 36.0–46.0)
HEMOGLOBIN: 14 g/dL (ref 12.0–15.0)
MCH: 31.8 pg (ref 26.0–34.0)
MCHC: 33.7 g/dL (ref 30.0–36.0)
MCV: 94.3 fL (ref 78.0–100.0)
Platelets: 350 10*3/uL (ref 150–400)
RBC: 4.4 MIL/uL (ref 3.87–5.11)
RDW: 12 % (ref 11.5–15.5)
WBC: 8.9 10*3/uL (ref 4.0–10.5)

## 2016-04-12 ENCOUNTER — Encounter (HOSPITAL_COMMUNITY)
Admission: RE | Disposition: A | Discharge status: Home / Self Care | Payer: Self-pay | Source: Ambulatory Visit | Attending: Obstetrics & Gynecology

## 2016-04-12 ENCOUNTER — Ambulatory Visit (HOSPITAL_COMMUNITY): Payer: Medicaid Other | Admitting: Anesthesiology

## 2016-04-12 ENCOUNTER — Ambulatory Visit (HOSPITAL_COMMUNITY)
Admission: RE | Admit: 2016-04-12 | Discharge: 2016-04-12 | Disposition: A | Discharge status: Home / Self Care | Payer: Medicaid Other | Source: Ambulatory Visit | Attending: Obstetrics & Gynecology | Admitting: Obstetrics & Gynecology

## 2016-04-12 DIAGNOSIS — Z302 Encounter for sterilization: Secondary | ICD-10-CM | POA: Insufficient documentation

## 2016-04-12 DIAGNOSIS — Z8249 Family history of ischemic heart disease and other diseases of the circulatory system: Secondary | ICD-10-CM | POA: Insufficient documentation

## 2016-04-12 DIAGNOSIS — Z4003 Encounter for prophylactic removal of fallopian tube(s): Secondary | ICD-10-CM | POA: Insufficient documentation

## 2016-04-12 DIAGNOSIS — E119 Type 2 diabetes mellitus without complications: Secondary | ICD-10-CM | POA: Insufficient documentation

## 2016-04-12 DIAGNOSIS — Z833 Family history of diabetes mellitus: Secondary | ICD-10-CM | POA: Insufficient documentation

## 2016-04-12 DIAGNOSIS — Z880 Allergy status to penicillin: Secondary | ICD-10-CM | POA: Insufficient documentation

## 2016-04-12 DIAGNOSIS — K660 Peritoneal adhesions (postprocedural) (postinfection): Secondary | ICD-10-CM | POA: Insufficient documentation

## 2016-04-12 DIAGNOSIS — I10 Essential (primary) hypertension: Secondary | ICD-10-CM | POA: Diagnosis not present

## 2016-04-12 HISTORY — PX: LYSIS OF ADHESION: SHX5961

## 2016-04-12 HISTORY — PX: LAPAROSCOPIC BILATERAL SALPINGECTOMY: SHX5889

## 2016-04-12 LAB — GLUCOSE, CAPILLARY
GLUCOSE-CAPILLARY: 114 mg/dL — AB (ref 65–99)
GLUCOSE-CAPILLARY: 128 mg/dL — AB (ref 65–99)

## 2016-04-12 SURGERY — SALPINGECTOMY, BILATERAL, LAPAROSCOPIC
Anesthesia: General | Site: Abdomen

## 2016-04-12 MED ORDER — HYDROMORPHONE HCL 1 MG/ML IJ SOLN
0.2500 mg | INTRAMUSCULAR | Status: DC | PRN
  Administered 2016-04-12: 0.5 mg via INTRAVENOUS
  Filled 2016-04-12: qty 0.5

## 2016-04-12 MED ORDER — MIDAZOLAM HCL 5 MG/5ML IJ SOLN
INTRAMUSCULAR | Status: DC | PRN
  Administered 2016-04-12: 2 mg via INTRAVENOUS

## 2016-04-12 MED ORDER — NEOSTIGMINE METHYLSULFATE 10 MG/10ML IV SOLN
INTRAVENOUS | Status: DC | PRN
  Administered 2016-04-12: 2 mg via INTRAVENOUS

## 2016-04-12 MED ORDER — LIDOCAINE HCL 1 % IJ SOLN
INTRAMUSCULAR | Status: DC | PRN
  Administered 2016-04-12: 25 mg via INTRADERMAL

## 2016-04-12 MED ORDER — KETOROLAC TROMETHAMINE 10 MG PO TABS: 10.0000 mg | ORAL_TABLET | Freq: Three times a day (TID) | ORAL | 0 refills | Status: DC | PRN

## 2016-04-12 MED ORDER — BUPIVACAINE LIPOSOME 1.3 % IJ SUSP
INTRAMUSCULAR | Status: AC
  Filled 2016-04-12: qty 20

## 2016-04-12 MED ORDER — GLYCOPYRROLATE 0.2 MG/ML IJ SOLN
INTRAMUSCULAR | Status: DC | PRN
  Administered 2016-04-12: 0.4 mg via INTRAVENOUS

## 2016-04-12 MED ORDER — ROCURONIUM BROMIDE 100 MG/10ML IV SOLN
INTRAVENOUS | Status: DC | PRN
  Administered 2016-04-12: 25 mg via INTRAVENOUS
  Administered 2016-04-12: 5 mg via INTRAVENOUS

## 2016-04-12 MED ORDER — ONDANSETRON HCL 8 MG PO TABS: 8.0000 mg | ORAL_TABLET | Freq: Three times a day (TID) | ORAL | 0 refills | Status: DC | PRN

## 2016-04-12 MED ORDER — PROPOFOL 10 MG/ML IV BOLUS
INTRAVENOUS | Status: AC
  Filled 2016-04-12: qty 20

## 2016-04-12 MED ORDER — GLYCOPYRROLATE 0.2 MG/ML IJ SOLN
INTRAMUSCULAR | Status: AC
  Filled 2016-04-12: qty 1

## 2016-04-12 MED ORDER — PROPOFOL 10 MG/ML IV BOLUS
INTRAVENOUS | Status: DC | PRN
  Administered 2016-04-12: 150 mg via INTRAVENOUS

## 2016-04-12 MED ORDER — ONDANSETRON HCL 4 MG/2ML IJ SOLN
INTRAMUSCULAR | Status: AC
  Filled 2016-04-12: qty 2

## 2016-04-12 MED ORDER — HYDROCODONE-ACETAMINOPHEN 5-325 MG PO TABS: 1.0000 | ORAL_TABLET | Freq: Four times a day (QID) | ORAL | 0 refills | Status: DC | PRN

## 2016-04-12 MED ORDER — KETOROLAC TROMETHAMINE 30 MG/ML IJ SOLN
INTRAMUSCULAR | Status: AC
  Filled 2016-04-12: qty 1

## 2016-04-12 MED ORDER — KETOROLAC TROMETHAMINE 30 MG/ML IJ SOLN
30.0000 mg | Freq: Once | INTRAMUSCULAR | Status: AC
  Administered 2016-04-12: 30 mg via INTRAVENOUS

## 2016-04-12 MED ORDER — MIDAZOLAM HCL 2 MG/2ML IJ SOLN
1.0000 mg | INTRAMUSCULAR | Status: DC | PRN
  Administered 2016-04-12: 2 mg via INTRAVENOUS

## 2016-04-12 MED ORDER — 0.9 % SODIUM CHLORIDE (POUR BTL) OPTIME
TOPICAL | Status: DC | PRN
  Administered 2016-04-12: 500 mL

## 2016-04-12 MED ORDER — BUPIVACAINE LIPOSOME 1.3 % IJ SUSP
INTRAMUSCULAR | Status: DC | PRN
Start: 2016-04-12 — End: 2016-04-12
  Administered 2016-04-12: 20 mL

## 2016-04-12 MED ORDER — FENTANYL CITRATE (PF) 250 MCG/5ML IJ SOLN
INTRAMUSCULAR | Status: AC
  Filled 2016-04-12: qty 5

## 2016-04-12 MED ORDER — BUPIVACAINE LIPOSOME 1.3 % IJ SUSP
20.0000 mL | Freq: Once | INTRAMUSCULAR | Status: DC
  Filled 2016-04-12: qty 20

## 2016-04-12 MED ORDER — LACTATED RINGERS IV SOLN
INTRAVENOUS | Status: DC
  Administered 2016-04-12: 10:00:00 via INTRAVENOUS

## 2016-04-12 MED ORDER — MIDAZOLAM HCL 2 MG/2ML IJ SOLN
INTRAMUSCULAR | Status: AC
  Filled 2016-04-12: qty 2

## 2016-04-12 MED ORDER — ONDANSETRON HCL 4 MG/2ML IJ SOLN
4.0000 mg | Freq: Once | INTRAMUSCULAR | Status: AC
  Administered 2016-04-12: 4 mg via INTRAVENOUS

## 2016-04-12 MED ORDER — FENTANYL CITRATE (PF) 100 MCG/2ML IJ SOLN
INTRAMUSCULAR | Status: DC | PRN
  Administered 2016-04-12 (×3): 50 ug via INTRAVENOUS

## 2016-04-12 SURGICAL SUPPLY — 31 items
BAG HAMPER (MISCELLANEOUS) ×5 IMPLANT
BLADE SURG SZ11 CARB STEEL (BLADE) ×5 IMPLANT
CLOTH BEACON ORANGE TIMEOUT ST (SAFETY) ×5 IMPLANT
COVER LIGHT HANDLE STERIS (MISCELLANEOUS) ×10 IMPLANT
ELECT REM PT RETURN 9FT ADLT (ELECTROSURGICAL) ×5
ELECTRODE REM PT RTRN 9FT ADLT (ELECTROSURGICAL) ×3 IMPLANT
GLOVE BIOGEL PI IND STRL 7.0 (GLOVE) ×3 IMPLANT
GLOVE BIOGEL PI IND STRL 8 (GLOVE) ×4 IMPLANT
GLOVE BIOGEL PI INDICATOR 7.0 (GLOVE) ×2
GLOVE BIOGEL PI INDICATOR 8 (GLOVE) ×4
GLOVE ECLIPSE 8.0 STRL XLNG CF (GLOVE) ×5 IMPLANT
GOWN STRL REUS W/TWL LRG LVL3 (GOWN DISPOSABLE) ×10 IMPLANT
GOWN STRL REUS W/TWL XL LVL3 (GOWN DISPOSABLE) ×5 IMPLANT
INST SET LAPROSCOPIC GYN AP (KITS) ×5 IMPLANT
KIT ROOM TURNOVER AP CYSTO (KITS) ×5 IMPLANT
MANIFOLD NEPTUNE II (INSTRUMENTS) ×5 IMPLANT
NDL INSUFFLATION 14GA 120MM (NEEDLE) ×2 IMPLANT
NEEDLE INSUFFLATION 14GA 120MM (NEEDLE) ×5 IMPLANT
PACK PERI GYN (CUSTOM PROCEDURE TRAY) ×5 IMPLANT
PAD ARMBOARD 7.5X6 YLW CONV (MISCELLANEOUS) ×5 IMPLANT
SET BASIN LINEN APH (SET/KITS/TRAYS/PACK) ×5 IMPLANT
SOLUTION ANTI FOG 6CC (MISCELLANEOUS) ×5 IMPLANT
SPONGE GAUZE 2X2 8PLY STER LF (GAUZE/BANDAGES/DRESSINGS) ×1
SPONGE GAUZE 2X2 8PLY STRL LF (GAUZE/BANDAGES/DRESSINGS) ×4 IMPLANT
STAPLER VISISTAT 35W (STAPLE) ×5 IMPLANT
SUT VICRYL 0 UR6 27IN ABS (SUTURE) ×5 IMPLANT
SYRINGE 10CC LL (SYRINGE) ×3 IMPLANT
TAPE CLOTH SURG 4X10 WHT LF (GAUZE/BANDAGES/DRESSINGS) ×3 IMPLANT
TROCAR ENDO BLADELESS 11MM (ENDOMECHANICALS) ×5 IMPLANT
TUBING INSUFFLATION (TUBING) ×5 IMPLANT
WARMER LAPAROSCOPE (MISCELLANEOUS) ×5 IMPLANT

## 2016-04-12 NOTE — Op Note (Signed)
Preoperative Diagnosis:  1.  Multiparous female desires permanent sterilization                                          2.  Elects to have bilateral salpingectomy for ovarian cancer                                                     prophylaxis  Postoperative Diagnosis:  Same as above + anterior abdominal wall adhesion from her previous Caesarean section  Procedure:  Laparoscopic Bilateral Salpingectomy                     Laparoscopic lysis of adhesion of omentum to the anterior abdominal wall at the previous C section site  Surgeon:  Rockne CoonsLuther H Yoshito Gaza  Jr MD  Anaesthesia: general  Findings:  Patient had normal pelvic anatomy and no intraperitoneal abnormalities.  Description of Operation:  Patient was taken to the OR and placed into supine position where she underwent general anaesthesia.  She was placed in the dorsal lithotomy position and prepped and draped in the usual sterile fashion.  An incision was made in the umbilicus and dissection taken down to the rectus fascia which was incised and opened.  The non bladed trocar was then placed and the peritoneal cavity was insufflated.  The above noted findings were observed.  Additional trocars were placed in the right and left lower quadrants under direct visualization without difficulty.  The Harmonic scalpel was employed and salpingectomy of both the right and left tubes was performed.   The tubes were removed from the peritoneal cavity and sent to pathology.  There was good hemostasis bilaterally.  The fascia, peritoneum and subcutaneous tissue were closed using 0 vicryl.  The skin was closed using staples.  Exparel 266 mg 20 cc was injected in the 3 incision trocar sites. The patient was awakened from anaesthesia and taken to the PACU with all counts being correct x 3.  The patient received  1 gram of ancef andToradol 30 mg IV preoperatively.  Susan Valdez H 04/12/2016 10:43 AM

## 2016-04-12 NOTE — Anesthesia Postprocedure Evaluation (Signed)
Anesthesia Post Note  Patient: Susan Valdez  Procedure(s) Performed: Procedure(s) (LRB): LAPAROSCOPIC BILATERAL SALPINGECTOMY (Bilateral) LYSIS OF ADHESION  Patient location during evaluation: PACU Anesthesia Type: General Level of consciousness: awake and patient cooperative Pain management: pain level controlled Vital Signs Assessment: post-procedure vital signs reviewed and stable Respiratory status: spontaneous breathing, nonlabored ventilation and respiratory function stable Cardiovascular status: blood pressure returned to baseline Postop Assessment: no signs of nausea or vomiting Anesthetic complications: no    Last Vitals:  Vitals:   04/12/16 0950 04/12/16 1055  BP:  113/62  Pulse:  82  Resp: (!) 29 (!) 22  Temp:  (P) 36.6 C    Last Pain:  Vitals:   04/12/16 1055  PainSc: (P) Asleep                 Ronelle Michie J

## 2016-04-12 NOTE — Anesthesia Procedure Notes (Signed)
Procedure Name: Intubation Date/Time: 04/12/2016 10:02 AM Performed by: Despina HiddenIDACAVAGE, Gilad Dugger J Pre-anesthesia Checklist: Patient identified, Patient being monitored, Timeout performed, Emergency Drugs available and Suction available Patient Re-evaluated:Patient Re-evaluated prior to inductionOxygen Delivery Method: Circle System Utilized Preoxygenation: Pre-oxygenation with 100% oxygen Intubation Type: IV induction Ventilation: Mask ventilation without difficulty and Oral airway inserted - appropriate to patient size Laryngoscope Size: Mac and 3 Grade View: Grade I Tube type: Oral Tube size: 7.0 mm Number of attempts: 1 Airway Equipment and Method: stylet Placement Confirmation: ETT inserted through vocal cords under direct vision,  positive ETCO2 and breath sounds checked- equal and bilateral Secured at: 22 cm Tube secured with: Tape Dental Injury: Teeth and Oropharynx as per pre-operative assessment

## 2016-04-12 NOTE — Transfer of Care (Signed)
Immediate Anesthesia Transfer of Care Note  Patient: Susan Valdez  Procedure(s) Performed: Procedure(s): LAPAROSCOPIC BILATERAL SALPINGECTOMY (Bilateral) LYSIS OF ADHESION  Patient Location: PACU  Anesthesia Type:General  Level of Consciousness: patient cooperative  Airway & Oxygen Therapy: Patient Spontanous Breathing and Patient connected to face mask oxygen  Post-op Assessment: Report given to RN, Post -op Vital signs reviewed and stable and Patient moving all extremities  Post vital signs: Reviewed and stable  Last Vitals:  Vitals:   04/12/16 0945 04/12/16 0950  BP: (!) 132/93   Resp: (!) 24 (!) 29    Last Pain: There were no vitals filed for this visit.    Patients Stated Pain Goal: 6 (04/12/16 0910)  Complications: No apparent anesthesia complications

## 2016-04-12 NOTE — Discharge Instructions (Signed)
Laparoscopic Tubal Ligation, Care After °Refer to this sheet in the next few weeks. These instructions provide you with information about caring for yourself after your procedure. Your health care provider may also give you more specific instructions. Your treatment has been planned according to current medical practices, but problems sometimes occur. Call your health care provider if you have any problems or questions after your procedure. °What can I expect after the procedure? °After the procedure, it is common to have: °· A sore throat. °· Discomfort in your shoulder. °· Mild discomfort or cramping in your abdomen. °· Gas pains. °· Pain or soreness in the area where the surgical cut (incision) was made. °· A bloated feeling. °· Tiredness. °· Nausea. °· Vomiting. °Follow these instructions at home: °Medicines °· Take over-the-counter and prescription medicines only as told by your health care provider. °· Do not take aspirin because it can cause bleeding. °· Do not drive or operate heavy machinery while taking prescription pain medicine. °Activity °· Rest for the rest of the day. °· Return to your normal activities as told by your health care provider. Ask your health care provider what activities are safe for you. °Incision care °· Follow instructions from your health care provider about how to take care of your incision. Make sure you: °¨ Wash your hands with soap and water before you change your bandage (dressing). If soap and water are not available, use Masters sanitizer. °¨ Change your dressing as told by your health care provider. °¨ Leave stitches (sutures) in place. They may need to stay in place for 2 weeks or longer. °· Check your incision area every day for signs of infection. Check for: °¨ More redness, swelling, or pain. °¨ More fluid or blood. °¨ Warmth. °¨ Pus or a bad smell. °Other Instructions °· Do not take baths, swim, or use a hot tub until your health care provider approves. You may take  showers. °· Keep all follow-up visits as told by your health care provider. This is important. °· Have someone help you with your daily household tasks for the first few days. °Contact a health care provider if: °· You have more redness, swelling, or pain around your incision. °· Your incision feels warm to the touch. °· You have pus or a bad smell coming from your incision. °· The edges of your incision break open after the sutures have been removed. °· Your pain does not improve after 2-3 days. °· You have a rash. °· You repeatedly become dizzy or light-headed. °· Your pain medicine is not helping. °· You are constipated. °Get help right away if: °· You have a fever. °· You faint. °· You have increasing pain in your abdomen. °· You have severe pain in one or both of your shoulders. °· You have fluid or blood coming from your sutures or from your vagina. °· You have shortness of breath or difficulty breathing. °· You have chest pain or leg pain. °· You have ongoing nausea, vomiting, or diarrhea. °This information is not intended to replace advice given to you by your health care provider. Make sure you discuss any questions you have with your health care provider. °Document Released: 11/04/2004 Document Revised: 09/20/2015 Document Reviewed: 03/28/2015 °Elsevier Interactive Patient Education © 2017 [employer] ° °

## 2016-04-12 NOTE — Interval H&P Note (Signed)
History and Physical Interval Note:  04/12/2016 9:47 AM  Susan FolksAmanda L Mysliwiec  has presented today for surgery, with the diagnosis of sterilization request  The various methods of treatment have been discussed with the patient and family. After consideration of risks, benefits and other options for treatment, the patient has consented to  Procedure(s): LAPAROSCOPIC BILATERAL SALPINGECTOMY (Bilateral) as a surgical intervention .  The patient's history has been reviewed, patient examined, no change in status, stable for surgery.  I have reviewed the patient's chart and labs.  Questions were answered to the patient's satisfaction.     EURE,LUTHER H

## 2016-04-12 NOTE — Anesthesia Preprocedure Evaluation (Signed)
Anesthesia Evaluation  Patient identified by MRN, date of birth, ID band Patient awake    Reviewed: Allergy & Precautions, NPO status , Patient's Chart, lab work & pertinent test results  Airway Mallampati: I  TM Distance: >3 FB     Dental  (+) Teeth Intact   Pulmonary neg pulmonary ROS,           Cardiovascular hypertension, Pt. on medications  Rhythm:Regular Rate:Normal     Neuro/Psych    GI/Hepatic negative GI ROS,   Endo/Other  diabetes, Type 2  Renal/GU      Musculoskeletal   Abdominal   Peds  Hematology   Anesthesia Other Findings   Reproductive/Obstetrics                             Anesthesia Physical Anesthesia Plan  ASA: II  Anesthesia Plan: General   Post-op Pain Management:    Induction: Intravenous  Airway Management Planned: Oral ETT  Additional Equipment:   Intra-op Plan:   Post-operative Plan: Extubation in OR  Informed Consent: I have reviewed the patients History and Physical, chart, labs and discussed the procedure including the risks, benefits and alternatives for the proposed anesthesia with the patient or authorized representative who has indicated his/her understanding and acceptance.     Plan Discussed with:   Anesthesia Plan Comments:         Anesthesia Quick Evaluation

## 2016-04-12 NOTE — H&P (Signed)
Preoperative History and Physical  Susan Valdez is a 30 y.o. G1P1 with Patient's last menstrual period was 02/25/2016. admitted for a laparoscopic bilateral salpingectomy for sterilization.  Discussed at length and had pt wait for 1 month, she definitely wants no more kids, choosed salpingectomy bilaterally for ovarian cancer prophylaxis  PMH:        Past Medical History:  Diagnosis Date  . Chlamydia   . Hypertension   . Increased serum lipids   . Pre-diabetes   . Toxemia in pregnancy    resolved    PSH:          Past Surgical History:  Procedure Laterality Date  . CESAREAN SECTION    . FIBULA FRACTURE SURGERY      POb/GynH:              OB History    Gravida Para Term Preterm AB Living   1 1       1    SAB TAB Ectopic Multiple Live Births                  SH:          Social History   Substance Use Topics   . Smoking status: Never Smoker   . Smokeless tobacco: Never Used   . Alcohol use Yes     Comment: once a month     FH:         Family History  Problem Relation Age of Onset  . Hypertension Mother   . Diabetes Mother   . Gestational diabetes Other      Allergies:      Allergies  Allergen Reactions  . Penicillins     Medications:       Current Outpatient Prescriptions:  .  hydrochlorothiazide (HYDRODIURIL) 25 MG tablet, Take 25 mg by mouth daily., Disp: , Rfl:  .  norethindrone (MICRONOR,CAMILA,ERRIN) 0.35 MG tablet, Take 1 tablet by mouth daily., Disp: , Rfl:   Review of Systems:   Review of Systems  Constitutional: Negative for fever, chills, weight loss, malaise/fatigue and diaphoresis.  HENT: Negative for hearing loss, ear pain, nosebleeds, congestion, sore throat, neck pain, tinnitus and ear discharge.   Eyes: Negative for blurred vision, double vision, photophobia, pain, discharge and redness.  Respiratory: Negative for cough, hemoptysis, sputum production, shortness of breath, wheezing and  stridor.   Cardiovascular: Negative for chest pain, palpitations, orthopnea, claudication, leg swelling and PND.  Gastrointestinal: Positive for abdominal pain. Negative for heartburn, nausea, vomiting, diarrhea, constipation, blood in stool and melena.  Genitourinary: Negative for dysuria, urgency, frequency, hematuria and flank pain.  Musculoskeletal: Negative for myalgias, back pain, joint pain and falls.  Skin: Negative for itching and rash.  Neurological: Negative for dizziness, tingling, tremors, sensory change, speech change, focal weakness, seizures, loss of consciousness, weakness and headaches.  Endo/Heme/Allergies: Negative for environmental allergies and polydipsia. Does not bruise/bleed easily.  Psychiatric/Behavioral: Negative for depression, suicidal ideas, hallucinations, memory loss and substance abuse. The patient is not nervous/anxious and does not have insomnia.      PHYSICAL EXAM:  Blood pressure 134/88, pulse 85, height 5\' 2"  (1.575 m), weight 175 lb 9.6 oz (79.7 kg), last menstrual period 02/25/2016.    Vitals reviewed. Constitutional: She is oriented to person, place, and time. She appears well-developed and well-nourished.  HENT:  Head: Normocephalic and atraumatic.  Right Ear: External ear normal.  Left Ear: External ear normal.  Nose: Nose normal.  Mouth/Throat: Oropharynx is clear and moist.  Eyes: Conjunctivae and EOM are normal. Pupils are equal, round, and reactive to light. Right eye exhibits no discharge. Left eye exhibits no discharge. No scleral icterus.  Neck: Normal range of motion. Neck supple. No tracheal deviation present. No thyromegaly present.  Cardiovascular: Normal rate, regular rhythm, normal heart sounds and intact distal pulses.  Exam reveals no gallop and no friction rub.   No murmur heard. Respiratory: Effort normal and breath sounds normal. No respiratory distress. She has no wheezes. She has no rales. She exhibits no tenderness.   GI: Soft. Bowel sounds are normal. She exhibits no distension and no mass. There is tenderness. There is no rebound and no guarding.  Genitourinary:       Vulva is normal without lesions Vagina is pink moist without discharge Cervix normal in appearance and pap is normal Uterus is normal size, contour, position, consistency, mobility, non-tender Adnexa is negative with normal sized ovaries by sonogram  Musculoskeletal: Normal range of motion. She exhibits no edema and no tenderness.  Neurological: She is alert and oriented to person, place, and time. She has normal reflexes. She displays normal reflexes. No cranial nerve deficit. She exhibits normal muscle tone. Coordination normal.  Skin: Skin is warm and dry. No rash noted. No erythema. No pallor.  Psychiatric: She has a normal mood and affect. Her behavior is normal. Judgment and thought content normal.    Labs: No results found for this or any previous visit (from the past 336 hour(s)).  EKG: No orders found for this or any previous visit.  Imaging Studies: ImagingResults  No results found.      Assessment: Parous female desires permanent sterilization  Plan: Laparoscopic bilateral salpingectomy for sterilization 02/11/2016  Susan Valdez 03/28/2016 5:05 PM

## 2016-04-17 ENCOUNTER — Encounter (HOSPITAL_COMMUNITY): Payer: Self-pay | Admitting: Obstetrics & Gynecology

## 2016-04-20 ENCOUNTER — Ambulatory Visit (INDEPENDENT_AMBULATORY_CARE_PROVIDER_SITE_OTHER): Payer: Self-pay | Admitting: Obstetrics & Gynecology

## 2016-04-20 ENCOUNTER — Encounter: Payer: Self-pay | Admitting: Obstetrics & Gynecology

## 2016-04-20 VITALS — BP 120/70 | HR 80 | Wt 176.0 lb

## 2016-04-20 DIAGNOSIS — Z9889 Other specified postprocedural states: Secondary | ICD-10-CM

## 2016-04-20 NOTE — Progress Notes (Signed)
  HPI: Patient returns for routine postoperative follow-up having undergone laparoscopic bilateral Caesarean section on 04/12/2016.  The patient's immediate postoperative recovery has been unremarkable. Since hospital discharge the patient reports no problems sore.   Current Outpatient Prescriptions: hydrochlorothiazide (HYDRODIURIL) 25 MG tablet, Take 25 mg by mouth daily., Disp: , Rfl:  HYDROcodone-acetaminophen (NORCO/VICODIN) 5-325 MG tablet, Take 1 tablet by mouth every 6 (six) hours as needed., Disp: 30 tablet, Rfl: 0 ibuprofen (ADVIL,MOTRIN) 200 MG tablet, Take 400 mg by mouth every 8 (eight) hours as needed (for pain.)., Disp: , Rfl:  ketorolac (TORADOL) 10 MG tablet, Take 1 tablet (10 mg total) by mouth every 8 (eight) hours as needed. (Patient not taking: Reported on 04/20/2016), Disp: 15 tablet, Rfl: 0  No current facility-administered medications for this visit.     Blood pressure 120/70, pulse 80, weight 176 lb (79.8 kg), last menstrual period 03/27/2016.  Physical Exam: Incisions x 3 are clean dry intact Abdomen soft non tender  Diagnostic Tests:   Pathology: benign  Impression: S/p Bilateral salpingectomy and take down of Caesarean section adhesions  Plan: yealy 1 year  Follow up:     Lazaro ArmsEURE,LUTHER H, MD

## 2017-01-10 ENCOUNTER — Encounter (HOSPITAL_COMMUNITY): Payer: Self-pay | Admitting: Emergency Medicine

## 2017-01-10 ENCOUNTER — Emergency Department (HOSPITAL_COMMUNITY)
Admission: EM | Admit: 2017-01-10 | Discharge: 2017-01-10 | Disposition: A | Discharge status: Home / Self Care | Payer: Self-pay | Attending: Emergency Medicine | Admitting: Emergency Medicine

## 2017-01-10 DIAGNOSIS — I10 Essential (primary) hypertension: Secondary | ICD-10-CM | POA: Insufficient documentation

## 2017-01-10 DIAGNOSIS — Z79899 Other long term (current) drug therapy: Secondary | ICD-10-CM | POA: Insufficient documentation

## 2017-01-10 DIAGNOSIS — F17200 Nicotine dependence, unspecified, uncomplicated: Secondary | ICD-10-CM | POA: Insufficient documentation

## 2017-01-10 DIAGNOSIS — J3081 Allergic rhinitis due to animal (cat) (dog) hair and dander: Secondary | ICD-10-CM | POA: Insufficient documentation

## 2017-01-10 LAB — RAPID STREP SCREEN (MED CTR MEBANE ONLY): Streptococcus, Group A Screen (Direct): NEGATIVE

## 2017-01-10 MED ORDER — FEXOFENADINE HCL 60 MG PO TABS: 60.0000 mg | ORAL_TABLET | Freq: Two times a day (BID) | ORAL | 1 refills | Status: DC

## 2017-01-10 MED ORDER — FLUTICASONE PROPIONATE 50 MCG/ACT NA SUSP: 2.0000 | Freq: Every day | NASAL | 1 refills | Status: DC

## 2017-01-10 NOTE — ED Provider Notes (Signed)
[facility]-EMER[facility]ENCY [facility]PT Provider Note   CSN: [id number] Arrival date & time: 01/10/17  1241     History   Chief Complaint Chief Complaint  Patient presents with  . Sore Throat    HPI Susan Valdez is a 31 y.o. female who presents to emergency department today for sore throat 3 days. The patient states that she recently got a new puppy this past week. Since then she has been having symptoms of itchy & watery eyes, itchy nose, sneezing and scratchy sore throat. She has not taken anything for symptoms. Was noted in triage the patient has intermittent headache and dizziness for the last several days however the patient denies this. She says that she felt a little "woozy" this morning upon awakening but this only lasted for a few seconds and then went away. She is not symptomatic from this currently. Denies fever, chills, inability to control secretions, N/V, abdominal pain, cough, congestion, voice change, dental disease, trauma, fever, HA, focal weakness, CP, SOB, palpitations, melena, diaphoresis, recent URI, changes in hearing, unilateral neck pain, unilateral weakness, facial asymmetry, difficulty with speech, change in gait, LOC, N/V, alcohol/drug use, or trauma. No new medications.   HPI  Past Medical History:  Diagnosis Date  . Chlamydia   . Hypertension   . Increased serum lipids   . Pre-diabetes   . Toxemia in pregnancy    resolved    There are no active problems to display for this patient.   Past Surgical History:  Procedure Laterality Date  . CESAREAN SECTION     x1  . FIBULA FRACTURE SURGERY Right   . LAPAROSCOPIC BILATERAL SALPINGECTOMY Bilateral 04/12/2016   Procedure: LAPAROSCOPIC BILATERAL SALPINGECTOMY;  Surgeon: Lazaro Arms, MD;  Location: [facility];  Service: Gynecology;  Laterality: Bilateral;  . LYSIS OF ADHESION N/A 04/12/2016   Procedure: LYSIS OF ADHESION;  Surgeon: Lazaro Arms, MD;  Location: [facility];  Service: Gynecology;  Laterality: N/A;    OB History     Gravida Para Term Preterm AB Living   SAB TAB Ectopic Multiple Live Births                   Home Medications    Prior to Admission medications   Medication Sig Start Date End Date Taking? Authorizing Provider  fexofenadine (ALLEGRA) 60 MG tablet Take 1 tablet (60 mg total) by mouth 2 (two) times daily. 01/10/17   Cambrea Kirt, Elmer Sow, PA-C  fluticasone (FLONASE) 50 MCG/ACT nasal spray Place 2 sprays into both nostrils daily. 01/10/17   Nandan Willems, Elmer Sow, PA-C  hydrochlorothiazide (HYDRODIURIL) 25 MG tablet Take 25 mg by mouth daily.    [provider]  HYDROcodone-acetaminophen (NORCO/VICODIN) 5-325 MG tablet Take 1 tablet by mouth every 6 (six) hours as needed. 04/12/16   Lazaro Arms, MD  ibuprofen (ADVIL,MOTRIN) 200 MG tablet Take 400 mg by mouth every 8 (eight) hours as needed (for pain.).    [provider]  ketorolac (TORADOL) 10 MG tablet Take 1 tablet (10 mg total) by mouth every 8 (eight) hours as needed. Patient not taking: Reported on 04/20/2016 04/12/16   Lazaro Arms, MD    Family History Family History  Problem Relation Age of Onset  . Hypertension Mother   . Diabetes Mother   . Gestational diabetes Other     Social History Social History  Substance Use Topics  . Smoking status: Light Tobacco Smoker  .  Smokeless tobacco: Never Used  . Alcohol use Yes     Comment: once a month     Allergies   Penicillins   Review of Systems Review of Systems  All other systems reviewed and are negative.    Physical Exam Updated Vital Signs BP (!) 145/86 (BP Location: Right Arm)   Pulse 84   Temp 98.2 F (36.8 C) (Oral)   Resp 18   Ht  (1.6 m)   Wt 81.6 kg (180 lb)   LMP 12/28/2016   SpO2 100%   BMI 31.89 kg/m   Physical Exam  Constitutional: She appears well-developed and well-nourished.  HENT:  Head: Normocephalic and atraumatic.  Right Ear: Hearing, tympanic membrane, external ear and ear canal normal.  Left  Ear: Hearing, tympanic membrane, external ear and ear canal normal.  Nose: Nose normal. No rhinorrhea. Right sinus exhibits no maxillary sinus tenderness and no frontal sinus tenderness. Left sinus exhibits no maxillary sinus tenderness and no frontal sinus tenderness.  Mouth/Throat: Uvula is midline, oropharynx is clear and moist and mucous membranes are normal. No tonsillar exudate.  The patient has normal phonation and is in control of secretions. No stridor.  Midline uvula without edema. Soft palate rises symmetrically. No tonsillar erythema or exudates. No PTA. Tongue protrusion is normal. No trismus. No creptius on neck palpation and patient has good dentition. No gingival erythema or fluctuance noted. Mucus membranes moist.   Eyes: Pupils are equal, round, and reactive to light. Right eye exhibits no discharge. Left eye exhibits no discharge. No scleral icterus.  Neck: Trachea normal. Neck supple. No spinous process tenderness present. No neck rigidity. Normal range of motion present.  Cardiovascular: Normal rate, regular rhythm and intact distal pulses.   No murmur heard. Pulses:      Radial pulses are 2+ on the right side, and 2+ on the left side.       Dorsalis pedis pulses are 2+ on the right side, and 2+ on the left side.       Posterior tibial pulses are 2+ on the right side, and 2+ on the left side.  No lower extremity swelling or edema. Calves symmetric in size bilaterally.  Pulmonary/Chest: Effort normal and breath sounds normal. She exhibits no tenderness.  Abdominal: Soft. Bowel sounds are normal. There is no tenderness. There is no rebound and no guarding.  Musculoskeletal: She exhibits no edema.  Lymphadenopathy:    She has no cervical adenopathy.  Neurological: She is alert.  Skin: Skin is warm and dry. No rash noted. She is not diaphoretic.  Psychiatric: She has a normal mood and affect.  Nursing note and vitals reviewed.    ED Treatments / Results  Labs (all labs  ordered are listed, but only abnormal results are displayed) Labs Reviewed  RAPID STREP SCREEN (NOT AT [facility])  CULTURE, GROUP A STREP [facility])    EKG  EKG Interpretation None       Radiology No results found.  Procedures Procedures (including critical care time)  Medications Ordered in ED Medications - No data to display   Initial Impression / Assessment and Plan / ED Course  I have reviewed the triage vital signs and the nursing notes.  Pertinent labs & imaging results that were available during my care of the patient were reviewed by me and considered in my medical decision making (see chart for details).     31 year old female presenting with itchy watery eyes, sneezing, scratchy sore throat after adopting a  puppy last week. Her symptoms are consistent with allergic rhinitis. The patient has normal phonation and is in control of secretions. There is no stridor or trismus. She has Midline uvula without exudates. No PTA. No creptius on neck palpation. No facial swelling. Strep test negative. No abx indicated. Discharged with symptomatic tx for allergies. Pt does not appear dehydrated. Pt able to drink water in ED without difficulty with intact air way. Recommended PCP follow up.  Final Clinical Impressions(s) / ED Diagnoses   Final diagnoses:  Allergic rhinitis due to animal hair and dander    New Prescriptions Discharge Medication List as of 01/10/2017  4:01 PM    START taking these medications   Details  fexofenadine (ALLEGRA) 60 MG tablet Take 1 tablet (60 mg total) by mouth 2 (two) times daily., Starting Wed 01/10/2017, Print    fluticasone (FLONASE) 50 MCG/ACT nasal spray Place 2 sprays into both nostrils daily., Starting Wed 01/10/2017, Print         Chistian Kasler, Elmer SowMichael M, PA-C 01/10/17 1625    Samuel JesterMcManus, Kathleen, DO 01/15/17 1506

## 2017-01-10 NOTE — Discharge Instructions (Signed)
Please read and follow all provided instructions.  Your diagnoses today include: Allergic rhinitis  Tests performed today include: Vital signs. See below for your results today.  Strep Test: This was negative. A throat culture was sent as a precaution and results will be available in 2-3 days. If it returns positive for strep, you will be called by our flow manager for further instructions.  Medications prescribed:  1. Allegra. Take daily, this is a non-drowsy antihistamine.  2. Flonase. This is a nasal steroid that will help with your symptoms. This can take up to 2 weeks before it starts working. Please take a prescribed daily.   Home care instructions:  At this time, it appears that your sore throat is caused by allergies. . Antibiotics do NOT help for this.   Follow-up instructions: Please follow-up with your primary care provider in 2-3 days for follow up.   Return instructions:  Return to the ED sooner for worsening condition, inability to swallow, breathing difficulty, new concerns.  Additional Information:  Your vital signs today were: BP (!) 145/86 (BP Location: Right Arm)    Pulse 84    Temp 98.2 F (36.8 C) (Oral)    Resp 18    Ht 5\' 3"  (1.6 m)    Wt 81.6 kg (180 lb)    LMP 12/28/2016    SpO2 100%    BMI 31.89 kg/m  If your blood pressure (BP) was elevated above 135/85 this visit, please have this repeated by your doctor within one month. ---------------

## 2017-01-10 NOTE — ED Triage Notes (Signed)
Pt reports sore throat and intermittent headache, dizziness for last several days. Pt reports sore throat became intense last night. Airway patent. nad noted.

## 2017-01-13 LAB — CULTURE, GROUP A STREP (THRC)

## 2018-12-16 DIAGNOSIS — R7309 Other abnormal glucose: Secondary | ICD-10-CM | POA: Diagnosis not present

## 2018-12-16 DIAGNOSIS — I10 Essential (primary) hypertension: Secondary | ICD-10-CM | POA: Diagnosis not present

## 2019-08-25 DIAGNOSIS — I10 Essential (primary) hypertension: Secondary | ICD-10-CM | POA: Diagnosis not present

## 2019-08-25 DIAGNOSIS — R7309 Other abnormal glucose: Secondary | ICD-10-CM | POA: Diagnosis not present

## 2019-08-25 DIAGNOSIS — E785 Hyperlipidemia, unspecified: Secondary | ICD-10-CM | POA: Diagnosis not present

## 2019-10-22 DIAGNOSIS — R7309 Other abnormal glucose: Secondary | ICD-10-CM | POA: Diagnosis not present

## 2019-10-22 DIAGNOSIS — E785 Hyperlipidemia, unspecified: Secondary | ICD-10-CM | POA: Diagnosis not present

## 2019-10-22 DIAGNOSIS — I10 Essential (primary) hypertension: Secondary | ICD-10-CM | POA: Diagnosis not present

## 2019-10-22 DIAGNOSIS — Z6831 Body mass index (BMI) 31.0-31.9, adult: Secondary | ICD-10-CM | POA: Diagnosis not present

## 2019-10-30 DIAGNOSIS — Z419 Encounter for procedure for purposes other than remedying health state, unspecified: Secondary | ICD-10-CM | POA: Diagnosis not present

## 2019-11-22 ENCOUNTER — Emergency Department (HOSPITAL_COMMUNITY): Payer: Medicaid Other

## 2019-11-22 ENCOUNTER — Other Ambulatory Visit: Payer: Self-pay

## 2019-11-22 ENCOUNTER — Emergency Department (HOSPITAL_COMMUNITY)
Admission: EM | Admit: 2019-11-22 | Discharge: 2019-11-22 | Disposition: A | Discharge status: Home / Self Care | Payer: Medicaid Other | Attending: Emergency Medicine | Admitting: Emergency Medicine

## 2019-11-22 ENCOUNTER — Encounter (HOSPITAL_COMMUNITY): Payer: Self-pay

## 2019-11-22 DIAGNOSIS — I10 Essential (primary) hypertension: Secondary | ICD-10-CM | POA: Diagnosis not present

## 2019-11-22 DIAGNOSIS — M5441 Lumbago with sciatica, right side: Secondary | ICD-10-CM | POA: Diagnosis not present

## 2019-11-22 DIAGNOSIS — M545 Low back pain: Secondary | ICD-10-CM | POA: Insufficient documentation

## 2019-11-22 DIAGNOSIS — F17210 Nicotine dependence, cigarettes, uncomplicated: Secondary | ICD-10-CM | POA: Insufficient documentation

## 2019-11-22 DIAGNOSIS — M5440 Lumbago with sciatica, unspecified side: Secondary | ICD-10-CM

## 2019-11-22 LAB — POC URINE PREG, ED: Preg Test, Ur: NEGATIVE

## 2019-11-22 MED ORDER — HYDROMORPHONE HCL 1 MG/ML IJ SOLN
1.0000 mg | Freq: Once | INTRAMUSCULAR | Status: AC
  Administered 2019-11-22: 1 mg via INTRAMUSCULAR
  Filled 2019-11-22: qty 1

## 2019-11-22 MED ORDER — IBUPROFEN 800 MG PO TABS: 800.0000 mg | ORAL_TABLET | Freq: Three times a day (TID) | ORAL | 1 refills | Status: DC | PRN

## 2019-11-22 MED ORDER — CYCLOBENZAPRINE HCL 10 MG PO TABS: 10.0000 mg | ORAL_TABLET | Freq: Two times a day (BID) | ORAL | 0 refills | Status: DC | PRN

## 2019-11-22 NOTE — Discharge Instructions (Signed)
Follow-up with Dr. Romeo Apple in the next week for recheck

## 2019-11-22 NOTE — ED Provider Notes (Signed)
[facility] EMERGENCY DEPARTMENT Provider Note   CSN: [id number] Arrival date & time: 11/22/19  0744     History Chief Complaint  Patient presents with  . Back Pain    Susan Valdez is a 34 y.o. female.  Patient complains of low back pain with radiation down her right leg  The history is provided by the patient and medical records. No language interpreter was used.  Back Pain Location:  Generalized Quality:  Aching Radiates to:  R posterior upper leg Pain severity:  Moderate Pain is:  Same all the time Onset quality:  Sudden Timing:  Constant Progression:  Worsening Chronicity:  New Relieved by:  Nothing Worsened by:  Nothing Associated symptoms: no abdominal pain, no chest pain and no headaches        Past Medical History:  Diagnosis Date  . Chlamydia   . Hypertension   . Increased serum lipids   . Pre-diabetes   . Toxemia in pregnancy    resolved    There are no problems to display for this patient.   Past Surgical History:  Procedure Laterality Date  . CESAREAN SECTION     x1  . FIBULA FRACTURE SURGERY Right   . LAPAROSCOPIC BILATERAL SALPINGECTOMY Bilateral 04/12/2016   Procedure: LAPAROSCOPIC BILATERAL SALPINGECTOMY;  Surgeon: Lazaro Arms, MD;  Location: [facility];  Service: Gynecology;  Laterality: Bilateral;  . LYSIS OF ADHESION N/A 04/12/2016   Procedure: LYSIS OF ADHESION;  Surgeon: Lazaro Arms, MD;  Location: [facility];  Service: Gynecology;  Laterality: N/A;     OB History    Gravida  1   Para  1   Term      Preterm      AB      Living  1     SAB      TAB      Ectopic      Multiple      Live Births              Family History  Problem Relation Age of Onset  . Hypertension Mother   . Diabetes Mother   . Gestational diabetes Other     Social History   Tobacco Use  . Smoking status: Light Tobacco Smoker  . Smokeless tobacco: Never Used  Vaping Use  . Vaping Use: Every day  Substance Use Topics  . Alcohol  use: Yes    Comment: once a month  . Drug use: No    Home Medications Prior to Admission medications   Medication Sig Start Date End Date Taking? Authorizing Provider  cyclobenzaprine (FLEXERIL) 10 MG tablet Take 1 tablet (10 mg total) by mouth 2 (two) times daily as needed for muscle spasms. 11/22/19   Bethann Berkshire, MD  fexofenadine (ALLEGRA) 60 MG tablet Take 1 tablet (60 mg total) by mouth 2 (two) times daily. 01/10/17   Maczis, Elmer Sow, PA-C  fluticasone (FLONASE) 50 MCG/ACT nasal spray Place 2 sprays into both nostrils daily. 01/10/17   Maczis, Elmer Sow, PA-C  hydrochlorothiazide (HYDRODIURIL) 25 MG tablet Take 25 mg by mouth daily.    [provider]  HYDROcodone-acetaminophen (NORCO/VICODIN) 5-325 MG tablet Take 1 tablet by mouth every 6 (six) hours as needed. 04/12/16   Lazaro Arms, MD  ibuprofen (ADVIL) 800 MG tablet Take 1 tablet (800 mg total) by mouth every 8 (eight) hours as needed. 11/22/19   Bethann Berkshire, MD  ketorolac (TORADOL) 10 MG tablet Take 1 tablet (10 mg  total) by mouth every 8 (eight) hours as needed. Patient not taking: Reported on 04/20/2016 04/12/16   Lazaro Arms, MD    Allergies    Penicillins  Review of Systems   Review of Systems  Constitutional: Negative for appetite change and fatigue.  HENT: Negative for congestion, ear discharge and sinus pressure.   Eyes: Negative for discharge.  Respiratory: Negative for cough.   Cardiovascular: Negative for chest pain.  Gastrointestinal: Negative for abdominal pain and diarrhea.  Genitourinary: Negative for frequency and hematuria.  Musculoskeletal: Positive for back pain.  Skin: Negative for rash.  Neurological: Negative for seizures and headaches.       Pain right leg  Psychiatric/Behavioral: Negative for hallucinations.    Physical Exam Updated Vital Signs BP (!) 134/78 (BP Location: Right Arm)   Pulse 66   Temp 98.4 F (36.9 C) (Oral)   Resp 18   Ht 5\' 2"  (1.575 m)   Wt 60.8 kg    LMP 10/30/2019 Comment: negative preg test  SpO2 99%   BMI 24.51 kg/m   Physical Exam Vitals and nursing note reviewed.  Constitutional:      Appearance: She is well-developed.  HENT:     Head: Normocephalic.     Nose: Nose normal.  Eyes:     General: No scleral icterus.    Conjunctiva/sclera: Conjunctivae normal.  Neck:     Thyroid: No thyromegaly.  Cardiovascular:     Rate and Rhythm: Normal rate and regular rhythm.     Heart sounds: No murmur heard.  No friction rub. No gallop.   Pulmonary:     Breath sounds: No stridor. No wheezing or rales.  Chest:     Chest wall: No tenderness.  Abdominal:     General: There is no distension.     Tenderness: There is no abdominal tenderness. There is no rebound.  Musculoskeletal:        General: Normal range of motion.     Cervical back: Neck supple.     Comments: Positive straight leg raise on the right  Lymphadenopathy:     Cervical: No cervical adenopathy.  Skin:    Findings: No erythema or rash.  Neurological:     Mental Status: She is alert and oriented to person, place, and time.     Motor: No abnormal muscle tone.     Coordination: Coordination normal.  Psychiatric:        Behavior: Behavior normal.     ED Results / Procedures / Treatments   Labs (all labs ordered are listed, but only abnormal results are displayed) Labs Reviewed  POC URINE PREG, ED    EKG None  Radiology DG Lumbar Spine Complete  Result Date: 11/22/2019 CLINICAL DATA:  Low back pain which radiates across both hips. Pain started while at work. EXAM: LUMBAR SPINE - COMPLETE 4+ VIEW COMPARISON:  None. FINDINGS: There is no evidence of lumbar spine fracture. Alignment is normal. Intervertebral disc spaces are maintained. IMPRESSION: Negative. Electronically Signed   By: 11/24/2019 M.D.   On: 11/22/2019 11:12    Procedures Procedures (including critical care time)  Medications Ordered in ED Medications  HYDROmorphone (DILAUDID) injection  1 mg (1 mg Intramuscular Given 11/22/19 1015)    ED Course  I have reviewed the triage vital signs and the nursing notes.  Pertinent labs & imaging results that were available during my care of the patient were reviewed by me and considered in my medical decision making (see chart for  details).    MDM Rules/Calculators/A&P                          Patient with sciatica pain running down right leg.  X-rays unremarkable.  She is placed on Motrin and Flexeril follow-up as needed       This patient presents to the ED for concern of back pain, this involves an extensive number of treatment options, and is a complaint that carries with it a high risk of complications and morbidity.  The differential diagnosis includes disc disease neuritis   Lab Tests:     Medicines ordered:   I ordered medication follow-up for pain  Imaging Studies ordered:   I ordered imaging studies which included plain films of the back unremarkable and  I independently visualized and interpreted imaging which showed unremarkable  Additional history obtained:   Additional history obtained from records  Previous records obtained and reviewed.  Consultations Obtained:    Reevaluation:  After the interventions stated above, I reevaluated the patient and found mildly improved  Critical Interventions:  .   Final Clinical Impression(s) / ED Diagnoses Final diagnoses:  Acute right-sided low back pain with sciatica, sciatica laterality unspecified    Rx / DC Orders ED Discharge Orders         Ordered    ibuprofen (ADVIL) 800 MG tablet  Every 8 hours PRN     Discontinue  Reprint     11/22/19 1128    cyclobenzaprine (FLEXERIL) 10 MG tablet  2 times daily PRN     Discontinue  Reprint     11/22/19 1128           Bethann Berkshire, MD 11/24/19 1657

## 2019-11-22 NOTE — ED Notes (Signed)
POC urine preg negative 

## 2019-11-22 NOTE — ED Triage Notes (Signed)
Pt reports right lower back pain since yesterday. No known injury. Pain began yesterday

## 2019-11-30 DIAGNOSIS — Z419 Encounter for procedure for purposes other than remedying health state, unspecified: Secondary | ICD-10-CM | POA: Diagnosis not present

## 2019-12-23 ENCOUNTER — Telehealth: Payer: Self-pay | Admitting: Orthopedic Surgery

## 2019-12-23 NOTE — Telephone Encounter (Signed)
Patient called to inquire about scheduling from her ER visit on 11/22/19.  Recommendation was to follow up with her PCP.  If PCP refers to [place] we can schedule.

## 2019-12-28 DIAGNOSIS — H5213 Myopia, bilateral: Secondary | ICD-10-CM | POA: Diagnosis not present

## 2019-12-31 DIAGNOSIS — Z419 Encounter for procedure for purposes other than remedying health state, unspecified: Secondary | ICD-10-CM | POA: Diagnosis not present

## 2020-01-26 ENCOUNTER — Ambulatory Visit (INDEPENDENT_AMBULATORY_CARE_PROVIDER_SITE_OTHER): Payer: Medicaid Other | Admitting: Internal Medicine

## 2020-01-26 ENCOUNTER — Encounter: Payer: Self-pay | Admitting: Internal Medicine

## 2020-01-26 ENCOUNTER — Other Ambulatory Visit: Payer: Self-pay

## 2020-01-26 VITALS — BP 123/80 | HR 84 | Temp 97.1°F | Resp 18 | Ht 62.0 in | Wt 167.4 lb

## 2020-01-26 DIAGNOSIS — E782 Mixed hyperlipidemia: Secondary | ICD-10-CM | POA: Insufficient documentation

## 2020-01-26 DIAGNOSIS — I10 Essential (primary) hypertension: Secondary | ICD-10-CM

## 2020-01-26 DIAGNOSIS — Z124 Encounter for screening for malignant neoplasm of cervix: Secondary | ICD-10-CM | POA: Diagnosis not present

## 2020-01-26 DIAGNOSIS — Z720 Tobacco use: Secondary | ICD-10-CM | POA: Insufficient documentation

## 2020-01-26 DIAGNOSIS — E785 Hyperlipidemia, unspecified: Secondary | ICD-10-CM

## 2020-01-26 DIAGNOSIS — Z7689 Persons encountering health services in other specified circumstances: Secondary | ICD-10-CM

## 2020-01-26 NOTE — Assessment & Plan Note (Signed)
Well-controlled on HCTZ Will obtain BMP and switch to ACEi/ARB in next visit Advised to check BP at home Advised DASH diet and moderate exercise

## 2020-01-26 NOTE — Assessment & Plan Note (Signed)
On Fenofibrate Check lipid panel

## 2020-01-26 NOTE — Assessment & Plan Note (Signed)
Care established Previous chart reviewed History and medications reviewed with the patient 

## 2020-01-26 NOTE — Patient Instructions (Signed)
Please continue to take medications for now as prescribed.  Please follow up with Ob./Gyn. for PA testing.  Please get COVID vaccine as soon as possible.  You are advised to 30-mins moderate exercise at least 5 days in a week.  Please follow low-salt/DASH diet.  DASH stands for Dietary Approaches to Stop Hypertension. The DASH diet is a healthy-eating plan designed to help treat or prevent high blood pressure (hypertension).  The DASH diet includes foods that are rich in potassium, calcium and magnesium. These nutrients help control blood pressure. The diet limits foods that are high in sodium, saturated fat and added sugars.  Studies have shown that the DASH diet can lower blood pressure in as little as two weeks. The diet can also lower low-density lipoprotein (LDL or "bad") cholesterol levels in the blood. High blood pressure and high LDL cholesterol levels are two major risk factors for heart disease and stroke.    DASH diet: Recommended servings The DASH diet provides daily and weekly nutritional goals. The number of servings you should have depends on your daily calorie needs.  Here's a look at the recommended servings from each food group for a 2,000-calorie-a-day DASH diet:  Grains: 6 to 8 servings a day. One serving is one slice bread, 1 ounce dry cereal, or 1/2 cup cooked cereal, rice or pasta. Vegetables: 4 to 5 servings a day. One serving is 1 cup raw leafy green vegetable, 1/2 cup cut-up raw or cooked vegetables, or 1/2 cup vegetable juice. Fruits: 4 to 5 servings a day. One serving is one medium fruit, 1/2 cup fresh, frozen or canned fruit, or 1/2 cup fruit juice. Fat-free or low-fat dairy products: 2 to 3 servings a day. One serving is 1 cup milk or yogurt, or 1 1/2 ounces cheese. Lean meats, poultry and fish: six 1-ounce servings or fewer a day. One serving is 1 ounce cooked meat, poultry or fish, or 1 egg. Nuts, seeds and legumes: 4 to 5 servings a week. One serving is  1/3 cup nuts, 2 tablespoons peanut butter, 2 tablespoons seeds, or 1/2 cup cooked legumes (dried beans or peas). Fats and oils: 2 to 3 servings a day. One serving is 1 teaspoon soft margarine, 1 teaspoon vegetable oil, 1 tablespoon mayonnaise or 2 tablespoons salad dressing. Sweets and added sugars: 5 servings or fewer a week. One serving is 1 tablespoon sugar, jelly or jam, 1/2 cup sorbet, or 1 cup lemonade.

## 2020-01-26 NOTE — Assessment & Plan Note (Signed)
Quit smoking last year Advised to avoid vaping  Discussed about risks of vaping associated lung injury. Patient expressed understanding and agrees to plan.

## 2020-01-26 NOTE — Progress Notes (Signed)
New Patient Office Visit  Subjective:  Patient ID: Susan Valdez, female    DOB: 25-Apr-1986  Age: 34 y.o. MRN: [id number]  CC:  Chief Complaint  Patient presents with  . New Patient (Initial Visit)    new pt former health dept pt no issues just establishing care     HPI Susan Valdez is 34 year old female with past medical history of hypertension and hyperlipidemia presents for establishing care.  She has been following up with health department clinic.  She states that she is bothered with her blood pressure medication because she gets nausea and has to urinate multiple times because of that.  She denies headache, dizziness, neck pain, chest pain, dyspnea, palpitations, abdominal pain, nausea, vomiting, constipation, diarrhea, dysuria, hematuria or LE edema.  She admits that she quit smoking about a year ago, but has been vaping since then.  She was advised to quit vaping, she agreed to the plan.  She states that she had last Pap test done about 3 years ago.  She has not had Covid vaccine as she is afraid of needles.  Her concern was addressed, and she agrees to get the Covid vaccine as soon as possible.  She denies to take the flu vaccine despite explaining the benefits of it.  Past Medical History:  Diagnosis Date  . Chlamydia   . Hypertension   . Increased serum lipids   . Pre-diabetes   . Toxemia in pregnancy    resolved    Past Surgical History:  Procedure Laterality Date  . CESAREAN SECTION     x1  . FIBULA FRACTURE SURGERY Right   . LAPAROSCOPIC BILATERAL SALPINGECTOMY Bilateral 04/12/2016   Procedure: LAPAROSCOPIC BILATERAL SALPINGECTOMY;  Surgeon: Lazaro Arms, MD;  Location: [facility];  Service: Gynecology;  Laterality: Bilateral;  . LYSIS OF ADHESION N/A 04/12/2016   Procedure: LYSIS OF ADHESION;  Surgeon: Lazaro Arms, MD;  Location: [facility];  Service: Gynecology;  Laterality: N/A;    Family History  Problem Relation Age of Onset  . Hypertension Mother   .  Diabetes Mother   . Gestational diabetes Other     Social History   Socioeconomic History  . Marital status: Single    Spouse name: Not on file  . Number of children: Not on file  . Years of education: Not on file  . Highest education level: Not on file  Occupational History  . Not on file  Tobacco Use  . Smoking status: Light Tobacco Smoker  . Smokeless tobacco: Never Used  Vaping Use  . Vaping Use: Every day  Substance and Sexual Activity  . Alcohol use: Yes    Comment: once a month  . Drug use: No  . Sexual activity: Yes    Birth control/protection: Pill  Other Topics Concern  . Not on file  Social History Narrative  . Not on file   Social Determinants of Health   Financial Resource Strain:   . Difficulty of Paying Living Expenses: Not on file  Food Insecurity:   . Worried About Programme researcher, broadcasting/film/video in the Last Year: Not on file  . Ran Out of Food in the Last Year: Not on file  Transportation Needs:   . Lack of Transportation (Medical): Not on file  . Lack of Transportation (Non-Medical): Not on file  Physical Activity:   . Days of Exercise per Week: Not on file  . Minutes of Exercise per Session: Not on file  Stress:   .  Feeling of Stress : Not on file  Social Connections:   . Frequency of Communication with Friends and Family: Not on file  . Frequency of Social Gatherings with Friends and Family: Not on file  . Attends Religious Services: Not on file  . Active Member of Clubs or Organizations: Not on file  . Attends Banker Meetings: Not on file  . Marital Status: Not on file  Intimate Partner Violence:   . Fear of Current or Ex-Partner: Not on file  . Emotionally Abused: Not on file  . Physically Abused: Not on file  . Sexually Abused: Not on file    ROS Review of Systems  Constitutional: Negative for chills and fever.  HENT: Negative for congestion, sinus pressure, sinus pain and sore throat.   Eyes: Negative for pain and discharge.    Respiratory: Negative for cough and shortness of breath.   Cardiovascular: Negative for chest pain and palpitations.  Gastrointestinal: Negative for abdominal pain, constipation, diarrhea, nausea and vomiting.  Endocrine: Negative for polydipsia and polyuria.  Genitourinary: Negative for dysuria and hematuria.  Musculoskeletal: Negative for neck pain and neck stiffness.  Skin: Negative for rash.  Neurological: Negative for dizziness and weakness.  Psychiatric/Behavioral: Negative for agitation and behavioral problems.    Objective:   Today's Vitals: BP 123/80 (BP Location: Right Arm, Patient Position: Sitting, Cuff Size: Normal)   Pulse 84   Temp (!) 97.1 F (36.2 C) (Temporal)   Resp 18   Ht 5\' 2"  (1.575 m)   Wt 167 lb 6.4 oz (75.9 kg)   SpO2 97%   BMI 30.62 kg/m   Physical Exam Vitals reviewed.  Constitutional:      General: She is not in acute distress.    Appearance: She is not diaphoretic.  HENT:     Head: Normocephalic and atraumatic.     Nose: Nose normal.     Mouth/Throat:     Mouth: Mucous membranes are moist.  Eyes:     General: No scleral icterus.    Extraocular Movements: Extraocular movements intact.     Pupils: Pupils are equal, round, and reactive to light.  Cardiovascular:     Rate and Rhythm: Normal rate and regular rhythm.     Pulses: Normal pulses.     Heart sounds: No murmur heard.   Pulmonary:     Breath sounds: Normal breath sounds. No wheezing or rales.  Abdominal:     Palpations: Abdomen is soft.     Tenderness: There is no abdominal tenderness.  Musculoskeletal:     Cervical back: Neck supple. No tenderness.     Right lower leg: No edema.     Left lower leg: No edema.  Skin:    General: Skin is warm.     Findings: No rash.  Neurological:     General: No focal deficit present.     Mental Status: She is alert and oriented to person, place, and time.  Psychiatric:        Mood and Affect: Mood normal.        Behavior: Behavior normal.       Assessment & Plan:   Encounter to establish care Care established Previous chart reviewed History and medications reviewed with the patient   Hypertension Well-controlled on HCTZ Will obtain BMP and switch to ACEi/ARB in next visit Advised to check BP at home Advised DASH diet and moderate exercise  Hyperlipidemia On Fenofibrate Check lipid panel  Vapes nicotine containing substance Quit smoking  last year Advised to avoid vaping  Discussed about risks of vaping associated lung injury. Patient expressed understanding and agrees to plan.  Referral for PAP testing provided.  Advised to get COVID vaccine as soon as possible.  Outpatient Encounter Medications as of 01/26/2020  Medication Sig  . fenofibrate (TRICOR) 145 MG tablet Take 145 mg by mouth daily.  . hydrochlorothiazide (HYDRODIURIL) 25 MG tablet Take 25 mg by mouth daily.  . [DISCONTINUED] cyclobenzaprine (FLEXERIL) 10 MG tablet Take 1 tablet (10 mg total) by mouth 2 (two) times daily as needed for muscle spasms. (Patient not taking: Reported on 01/26/2020)  . [DISCONTINUED] fexofenadine (ALLEGRA) 60 MG tablet Take 1 tablet (60 mg total) by mouth 2 (two) times daily. (Patient not taking: Reported on 01/26/2020)  . [DISCONTINUED] fluticasone (FLONASE) 50 MCG/ACT nasal spray Place 2 sprays into both nostrils daily. (Patient not taking: Reported on 01/26/2020)  . [DISCONTINUED] HYDROcodone-acetaminophen (NORCO/VICODIN) 5-325 MG tablet Take 1 tablet by mouth every 6 (six) hours as needed. (Patient not taking: Reported on 01/26/2020)  . [DISCONTINUED] ibuprofen (ADVIL) 800 MG tablet Take 1 tablet (800 mg total) by mouth every 8 (eight) hours as needed. (Patient not taking: Reported on 01/26/2020)  . [DISCONTINUED] ketorolac (TORADOL) 10 MG tablet Take 1 tablet (10 mg total) by mouth every 8 (eight) hours as needed. (Patient not taking: Reported on 04/20/2016)   No facility-administered encounter medications on file as of  01/26/2020.    Follow-up: Return in about 3 weeks (around 02/16/2020).   Anabel Halon, MD

## 2020-01-27 ENCOUNTER — Telehealth: Payer: Self-pay | Admitting: *Deleted

## 2020-01-27 ENCOUNTER — Other Ambulatory Visit: Payer: Self-pay | Admitting: Internal Medicine

## 2020-01-27 DIAGNOSIS — R7401 Elevation of levels of liver transaminase levels: Secondary | ICD-10-CM

## 2020-01-27 LAB — SPECIMEN STATUS REPORT

## 2020-01-27 NOTE — Telephone Encounter (Signed)
Scheduled pt [place] for October at [facility] attempted to call pt NA NVM

## 2020-01-28 DIAGNOSIS — Z20828 Contact with and (suspected) exposure to other viral communicable diseases: Secondary | ICD-10-CM | POA: Diagnosis not present

## 2020-01-28 LAB — URINALYSIS, ROUTINE W REFLEX MICROSCOPIC

## 2020-01-29 LAB — T4 AND TSH
T4, Total: 6.6 ug/dL (ref 4.5–12.0)
TSH: 0.842 u[IU]/mL (ref 0.450–4.500)

## 2020-01-29 LAB — CBC WITH DIFFERENTIAL/PLATELET
Basophils Absolute: 0 10*3/uL (ref 0.0–0.2)
Basos: 1 %
EOS (ABSOLUTE): 0.1 10*3/uL (ref 0.0–0.4)
Eos: 2 %
Hematocrit: 42.3 % (ref 34.0–46.6)
Hemoglobin: 13.9 g/dL (ref 11.1–15.9)
Immature Grans (Abs): 0 10*3/uL (ref 0.0–0.1)
Immature Granulocytes: 0 %
Lymphocytes Absolute: 1.7 10*3/uL (ref 0.7–3.1)
Lymphs: 36 %
MCH: 30.7 pg (ref 26.6–33.0)
MCHC: 32.9 g/dL (ref 31.5–35.7)
MCV: 93 fL (ref 79–97)
Monocytes Absolute: 0.4 10*3/uL (ref 0.1–0.9)
Monocytes: 8 %
Neutrophils Absolute: 2.4 10*3/uL (ref 1.4–7.0)
Neutrophils: 53 %
Platelets: 311 10*3/uL (ref 150–450)
RBC: 4.53 x10E6/uL (ref 3.77–5.28)
RDW: 11.8 % (ref 11.7–15.4)
WBC: 4.5 10*3/uL (ref 3.4–10.8)

## 2020-01-29 LAB — CMP14+EGFR
ALT: 98 IU/L — ABNORMAL HIGH (ref 0–32)
AST: 84 IU/L — ABNORMAL HIGH (ref 0–40)
Albumin/Globulin Ratio: 1.6 (ref 1.2–2.2)
Albumin: 4.6 g/dL (ref 3.8–4.8)
Alkaline Phosphatase: 69 IU/L (ref 44–121)
BUN/Creatinine Ratio: 12 (ref 9–23)
BUN: 7 mg/dL (ref 6–20)
Bilirubin Total: 0.3 mg/dL (ref 0.0–1.2)
CO2: 27 mmol/L (ref 20–29)
Calcium: 9.8 mg/dL (ref 8.7–10.2)
Chloride: 100 mmol/L (ref 96–106)
Creatinine, Ser: 0.58 mg/dL (ref 0.57–1.00)
GFR calc Af Amer: 141 mL/min/{1.73_m2} (ref >59)
GFR calc non Af Amer: 122 mL/min/{1.73_m2} (ref >59)
Globulin, Total: 2.8 g/dL (ref 1.5–4.5)
Glucose: 99 mg/dL (ref 65–99)
Potassium: 4 mmol/L (ref 3.5–5.2)
Sodium: 141 mmol/L (ref 134–144)
Total Protein: 7.4 g/dL (ref 6.0–8.5)

## 2020-01-29 LAB — LIPID PANEL
Chol/HDL Ratio: 6.7 ratio — ABNORMAL HIGH (ref 0.0–4.4)
Cholesterol, Total: 235 mg/dL — ABNORMAL HIGH (ref 100–199)
HDL: 35 mg/dL — ABNORMAL LOW (ref >39)
LDL Chol Calc (NIH): 172 mg/dL — ABNORMAL HIGH (ref 0–99)
Triglycerides: 150 mg/dL — ABNORMAL HIGH (ref 0–149)
VLDL Cholesterol Cal: 28 mg/dL (ref 5–40)

## 2020-01-30 DIAGNOSIS — Z419 Encounter for procedure for purposes other than remedying health state, unspecified: Secondary | ICD-10-CM | POA: Diagnosis not present

## 2020-02-04 ENCOUNTER — Ambulatory Visit (HOSPITAL_COMMUNITY): Payer: Medicaid Other

## 2020-02-16 ENCOUNTER — Encounter: Payer: Self-pay | Admitting: Internal Medicine

## 2020-02-16 ENCOUNTER — Other Ambulatory Visit: Payer: Self-pay

## 2020-02-16 ENCOUNTER — Ambulatory Visit (INDEPENDENT_AMBULATORY_CARE_PROVIDER_SITE_OTHER): Payer: Medicaid Other | Admitting: Internal Medicine

## 2020-02-16 VITALS — BP 124/86 | HR 83 | Temp 97.1°F | Resp 16 | Ht 62.0 in | Wt 171.1 lb

## 2020-02-16 DIAGNOSIS — E785 Hyperlipidemia, unspecified: Secondary | ICD-10-CM | POA: Diagnosis not present

## 2020-02-16 DIAGNOSIS — I10 Essential (primary) hypertension: Secondary | ICD-10-CM | POA: Diagnosis not present

## 2020-02-16 DIAGNOSIS — R7401 Elevation of levels of liver transaminase levels: Secondary | ICD-10-CM | POA: Diagnosis not present

## 2020-02-16 MED ORDER — LISINOPRIL 10 MG PO TABS
10.0000 mg | ORAL_TABLET | Freq: Every day | ORAL | 1 refills | Status: DC
Start: 2020-02-16 — End: 2020-08-24

## 2020-02-16 NOTE — Assessment & Plan Note (Signed)
Lipid profile reviewed On Fenofibrate currently CMP revealed high AST/ALT, will check US abdomen before starting statin

## 2020-02-16 NOTE — Assessment & Plan Note (Addendum)
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

## 2020-02-16 NOTE — Patient Instructions (Signed)
Please start taking Lisinopril 10 mg once daily. Please stop taking Hydrochlorothiazide.  You are being scheduled to get Ultrasound of the abdomen done.  Please try to perform at least 150 mins/week moderate exercise/walking.   DASH stands for Dietary Approaches to Stop Hypertension. The DASH diet is a healthy-eating plan designed to help treat or prevent high blood pressure (hypertension).  The DASH diet includes foods that are rich in potassium, calcium and magnesium. These nutrients help control blood pressure. The diet limits foods that are high in sodium, saturated fat and added sugars.  Studies have shown that the DASH diet can lower blood pressure in as little as two weeks. The diet can also lower low-density lipoprotein (LDL or "bad") cholesterol levels in the blood. High blood pressure and high LDL cholesterol levels are two major risk factors for heart disease and stroke.    DASH diet: Recommended servings The DASH diet provides daily and weekly nutritional goals. The number of servings you should have depends on your daily calorie needs.  Here's a look at the recommended servings from each food group for a 2,000-calorie-a-day DASH diet:  Grains: 6 to 8 servings a day. One serving is one slice bread, 1 ounce dry cereal, or 1/2 cup cooked cereal, rice or pasta. Vegetables: 4 to 5 servings a day. One serving is 1 cup raw leafy green vegetable, 1/2 cup cut-up raw or cooked vegetables, or 1/2 cup vegetable juice. Fruits: 4 to 5 servings a day. One serving is one medium fruit, 1/2 cup fresh, frozen or canned fruit, or 1/2 cup fruit juice. Fat-free or low-fat dairy products: 2 to 3 servings a day. One serving is 1 cup milk or yogurt, or 1 1/2 ounces cheese. Lean meats, poultry and fish: six 1-ounce servings or fewer a day. One serving is 1 ounce cooked meat, poultry or fish, or 1 egg. Nuts, seeds and legumes: 4 to 5 servings a week. One serving is 1/3 cup nuts, 2 tablespoons peanut  butter, 2 tablespoons seeds, or 1/2 cup cooked legumes (dried beans or peas). Fats and oils: 2 to 3 servings a day. One serving is 1 teaspoon soft margarine, 1 teaspoon vegetable oil, 1 tablespoon mayonnaise or 2 tablespoons salad dressing. Sweets and added sugars: 5 servings or fewer a week. One serving is 1 tablespoon sugar, jelly or jam, 1/2 cup sorbet, or 1 cup lemonade.

## 2020-02-16 NOTE — Assessment & Plan Note (Addendum)
BP Readings from Last 3 Encounters:  02/16/20 124/86  01/26/20 123/80  11/22/19 (!) 132/68   Discontinued HCTZ as patient wants to avoid water pill due to frequency of urination Will switch to Lisinopril also considering long-term cardiovascular benefit Emphasized the importance of exercise, at least 150 mins/week DASH diet

## 2020-02-16 NOTE — Progress Notes (Signed)
New Patient Office Visit  Subjective:  Patient ID: Susan Valdez, female    DOB: 11/28/1985  Age: 34 y.o. MRN: [id number]  CC:  Chief Complaint  Patient presents with  . Follow-up    3 week follow up bp     HPI TSION INGHRAM is a 34 year old female with PMH of HTN and HLD presents for follow up of HTN and review of blood tests. Patient is bothered by HCTZ as she has to go bathroom multiple times and it disturbs her at work. She denies fever, chills, dysuria or hematuria.  BP is well-controlled. Denies headache, dizziness, chest pain, dyspnea or palpitations. She has tried DASH diet. Has been cutting down on soft drinks.  Patient was scheduled to get US abdomen for evaluation of transaminitis, but did not respond despite multiple attempts for the appointment. Reordered and provided the details of the appointment for [place] RUQ abdomen.  Past Medical History:  Diagnosis Date  . Chlamydia   . Hypertension   . Increased serum lipids   . Pre-diabetes   . Toxemia in pregnancy    resolved    Past Surgical History:  Procedure Laterality Date  . CESAREAN SECTION     x1  . FIBULA FRACTURE SURGERY Right   . LAPAROSCOPIC BILATERAL SALPINGECTOMY Bilateral 04/12/2016   Procedure: LAPAROSCOPIC BILATERAL SALPINGECTOMY;  Surgeon: Lazaro Arms, MD;  Location: [facility];  Service: Gynecology;  Laterality: Bilateral;  . LYSIS OF ADHESION N/A 04/12/2016   Procedure: LYSIS OF ADHESION;  Surgeon: Lazaro Arms, MD;  Location: [facility];  Service: Gynecology;  Laterality: N/A;    Family History  Problem Relation Age of Onset  . Hypertension Mother   . Diabetes Mother   . Gestational diabetes Other     Social History   Socioeconomic History  . Marital status: Single    Spouse name: Not on file  . Number of children: Not on file  . Years of education: Not on file  . Highest education level: Not on file  Occupational History  . Not on file  Tobacco Use  . Smoking status: Light Tobacco Smoker   . Smokeless tobacco: Never Used  Vaping Use  . Vaping Use: Every day  Substance and Sexual Activity  . Alcohol use: Yes    Comment: once a month  . Drug use: No  . Sexual activity: Yes    Birth control/protection: Pill  Other Topics Concern  . Not on file  Social History Narrative  . Not on file   Social Determinants of Health   Financial Resource Strain:   . Difficulty of Paying Living Expenses: Not on file  Food Insecurity:   . Worried About Programme researcher, broadcasting/film/video in the Last Year: Not on file  . Ran Out of Food in the Last Year: Not on file  Transportation Needs:   . Lack of Transportation (Medical): Not on file  . Lack of Transportation (Non-Medical): Not on file  Physical Activity:   . Days of Exercise per Week: Not on file  . Minutes of Exercise per Session: Not on file  Stress:   . Feeling of Stress : Not on file  Social Connections:   . Frequency of Communication with Friends and Family: Not on file  . Frequency of Social Gatherings with Friends and Family: Not on file  . Attends Religious Services: Not on file  . Active Member of Clubs or Organizations: Not on file  . Attends Banker  Meetings: Not on file  . Marital Status: Not on file  Intimate Partner Violence:   . Fear of Current or Ex-Partner: Not on file  . Emotionally Abused: Not on file  . Physically Abused: Not on file  . Sexually Abused: Not on file    ROS Review of Systems  Constitutional: Negative for chills and fever.  HENT: Negative for congestion, sinus pressure, sinus pain and sore throat.   Eyes: Negative for pain and discharge.  Respiratory: Negative for cough and shortness of breath.   Cardiovascular: Negative for chest pain and palpitations.  Gastrointestinal: Negative for abdominal pain, constipation, diarrhea, nausea and vomiting.  Endocrine: Negative for polydipsia and polyuria.  Genitourinary: Negative for dysuria and hematuria.  Musculoskeletal: Negative for neck pain  and neck stiffness.  Skin: Negative for rash.  Neurological: Negative for dizziness, syncope, weakness and headaches.  Psychiatric/Behavioral: Negative for agitation and behavioral problems.    Objective:   Today's Vitals: BP 124/86 (BP Location: Right Arm, Patient Position: Sitting, Cuff Size: Normal)   Pulse 83   Temp (!) 97.1 F (36.2 C) (Temporal)   Resp 16   Ht 5\' 2"  (1.575 m)   Wt 171 lb 1.9 oz (77.6 kg)   SpO2 98%   BMI 31.30 kg/m   Physical Exam Vitals reviewed.  Constitutional:      General: She is not in acute distress.    Appearance: She is not diaphoretic.  HENT:     Head: Normocephalic and atraumatic.     Nose: Nose normal.     Mouth/Throat:     Mouth: Mucous membranes are moist.  Eyes:     General: No scleral icterus.    Extraocular Movements: Extraocular movements intact.     Pupils: Pupils are equal, round, and reactive to light.  Cardiovascular:     Rate and Rhythm: Normal rate and regular rhythm.     Pulses: Normal pulses.     Heart sounds: Normal heart sounds. No murmur heard.   Pulmonary:     Breath sounds: Normal breath sounds. No wheezing or rales.  Abdominal:     General: There is no distension.     Palpations: Abdomen is soft.     Tenderness: There is no abdominal tenderness.  Musculoskeletal:     Cervical back: Neck supple. No tenderness.     Right lower leg: No edema.     Left lower leg: No edema.  Skin:    General: Skin is warm.     Findings: No rash.  Neurological:     General: No focal deficit present.     Mental Status: She is alert and oriented to person, place, and time.     Sensory: No sensory deficit.     Motor: No weakness.  Psychiatric:        Mood and Affect: Mood normal.        Behavior: Behavior normal.     Assessment & Plan:   Problem List Items Addressed This Visit      Cardiovascular and Mediastinum   Hypertension - Primary    BP Readings from Last 3 Encounters:  02/16/20 124/86  01/26/20 123/80  11/22/19  (!) 132/68   Discontinued HCTZ as patient wants to avoid water pill due to frequency of urination Will switch to Lisinopril also considering long-term cardiovascular benefit Emphasized the importance of exercise, at least 150 mins/week DASH diet       Relevant Medications   lisinopril (ZESTRIL) 10 MG tablet  Other   Hyperlipidemia    Lipid profile reviewed On Fenofibrate currently CMP revealed high AST/ALT, will check US abdomen before starting statin      Relevant Medications   lisinopril (ZESTRIL) 10 MG tablet   Transaminitis    Lab Results  Component Value Date   ALT 98 (H) 01/26/2020   AST 84 (H) 01/26/2020   ALKPHOS 69 01/26/2020   BILITOT 0.3 01/26/2020   No acute symptoms currently, 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 [DISCONTINUED] hydrochlorothiazide (HYDRODIURIL) 25 MG tablet Take 25 mg by mouth daily.  Marland Kitchen lisinopril (ZESTRIL) 10 MG tablet Take 1 tablet (10 mg total) by mouth daily.  . [DISCONTINUED] fenofibrate (TRICOR) 145 MG tablet Take 145 mg by mouth daily. (Patient not taking: Reported on 02/16/2020)   No facility-administered encounter medications on file as of 02/16/2020.    Follow-up: Return in about 4 months (around 06/18/2020).   Anabel Halon, MD

## 2020-02-20 ENCOUNTER — Ambulatory Visit (HOSPITAL_COMMUNITY): Payer: Medicaid Other

## 2020-02-23 ENCOUNTER — Other Ambulatory Visit: Payer: Medicaid Other | Admitting: Adult Health

## 2020-03-01 ENCOUNTER — Other Ambulatory Visit (HOSPITAL_COMMUNITY): Payer: Medicaid Other

## 2020-03-01 DIAGNOSIS — Z419 Encounter for procedure for purposes other than remedying health state, unspecified: Secondary | ICD-10-CM | POA: Diagnosis not present

## 2020-03-08 ENCOUNTER — Other Ambulatory Visit (HOSPITAL_COMMUNITY)
Admission: RE | Admit: 2020-03-08 | Discharge: 2020-03-08 | Disposition: A | Discharge status: Home / Self Care | Payer: Medicaid Other | Source: Ambulatory Visit | Attending: Obstetrics & Gynecology | Admitting: Obstetrics & Gynecology

## 2020-03-08 ENCOUNTER — Ambulatory Visit (INDEPENDENT_AMBULATORY_CARE_PROVIDER_SITE_OTHER): Payer: Medicaid Other | Admitting: Obstetrics & Gynecology

## 2020-03-08 ENCOUNTER — Other Ambulatory Visit: Payer: Self-pay

## 2020-03-08 ENCOUNTER — Encounter: Payer: Self-pay | Admitting: Obstetrics & Gynecology

## 2020-03-08 ENCOUNTER — Ambulatory Visit (HOSPITAL_COMMUNITY)
Admission: RE | Admit: 2020-03-08 | Discharge: 2020-03-08 | Disposition: A | Discharge status: Home / Self Care | Payer: Medicaid Other | Source: Ambulatory Visit | Attending: Internal Medicine | Admitting: Internal Medicine

## 2020-03-08 VITALS — BP 116/79 | HR 90 | Ht 62.0 in | Wt 167.0 lb

## 2020-03-08 DIAGNOSIS — Z01419 Encounter for gynecological examination (general) (routine) without abnormal findings: Secondary | ICD-10-CM | POA: Insufficient documentation

## 2020-03-08 DIAGNOSIS — R7401 Elevation of levels of liver transaminase levels: Secondary | ICD-10-CM | POA: Diagnosis not present

## 2020-03-08 DIAGNOSIS — K760 Fatty (change of) liver, not elsewhere classified: Secondary | ICD-10-CM | POA: Diagnosis not present

## 2020-03-08 NOTE — Progress Notes (Signed)
Subjective:     Susan Valdez is a 34 y.o. female here for a routine exam.  No LMP recorded. (Menstrual status: Other). G1P1 Birth Control Method:  Bilateral salpingectomy Menstrual Calendar(currently): regular  Current complaints: none.   Current acute medical issues:  none   Recent Gynecologic History No LMP recorded. (Menstrual status: Other). Last Pap: 2016,  normal Last mammogram: n/a,    Past Medical History:  Diagnosis Date  . Chlamydia   . Hypertension   . Increased serum lipids   . Pre-diabetes   . Toxemia in pregnancy    resolved    Past Surgical History:  Procedure Laterality Date  . CESAREAN SECTION     x1  . FIBULA FRACTURE SURGERY Right   . LAPAROSCOPIC BILATERAL SALPINGECTOMY Bilateral 04/12/2016   Procedure: LAPAROSCOPIC BILATERAL SALPINGECTOMY;  Surgeon: Lazaro Arms, MD;  Location: [facility];  Service: Gynecology;  Laterality: Bilateral;  . LYSIS OF ADHESION N/A 04/12/2016   Procedure: LYSIS OF ADHESION;  Surgeon: Lazaro Arms, MD;  Location: [facility];  Service: Gynecology;  Laterality: N/A;    OB History    Gravida  1   Para  1   Term      Preterm      AB      Living  1     SAB      TAB      Ectopic      Multiple      Live Births              Social History   Socioeconomic History  . Marital status: Single    Spouse name: Not on file  . Number of children: Not on file  . Years of education: Not on file  . Highest education level: Not on file  Occupational History  . Not on file  Tobacco Use  . Smoking status: Light Tobacco Smoker  . Smokeless tobacco: Never Used  Vaping Use  . Vaping Use: Every day  Substance and Sexual Activity  . Alcohol use: Yes    Comment: once a month  . Drug use: No  . Sexual activity: Yes    Birth control/protection: Pill  Other Topics Concern  . Not on file  Social History Narrative  . Not on file   Social Determinants of Health   Financial Resource Strain: Low Risk   . Difficulty of  Paying Living Expenses: Not hard at all  Food Insecurity: No Food Insecurity  . Worried About Programme researcher, broadcasting/film/video in the Last Year: Never true  . Ran Out of Food in the Last Year: Never true  Transportation Needs: No Transportation Needs  . Lack of Transportation (Medical): No  . Lack of Transportation (Non-Medical): No  Physical Activity: Insufficiently Active  . Days of Exercise per Week: 2 days  . Minutes of Exercise per Session: 30 min  Stress: No Stress Concern Present  . Feeling of Stress : Not at all  Social Connections: Moderately Isolated  . Frequency of Communication with Friends and Family: More than three times a week  . Frequency of Social Gatherings with Friends and Family: Once a week  . Attends Religious Services: More than 4 times per year  . Active Member of Clubs or Organizations: No  . Attends Banker Meetings: Never  . Marital Status: Never married    Family History  Problem Relation Age of Onset  . Hypertension Mother   . Diabetes Mother   .  Gestational diabetes Other      Current Outpatient Medications:  .  lisinopril (ZESTRIL) 10 MG tablet, Take 1 tablet (10 mg total) by mouth daily., Disp: 90 tablet, Rfl: 1  Review of Systems  Review of Systems  Constitutional: Negative for fever, chills, weight loss, malaise/fatigue and diaphoresis.  HENT: Negative for hearing loss, ear pain, nosebleeds, congestion, sore throat, neck pain, tinnitus and ear discharge.   Eyes: Negative for blurred vision, double vision, photophobia, pain, discharge and redness.  Respiratory: Negative for cough, hemoptysis, sputum production, shortness of breath, wheezing and stridor.   Cardiovascular: Negative for chest pain, palpitations, orthopnea, claudication, leg swelling and PND.  Gastrointestinal: negative for abdominal pain. Negative for heartburn, nausea, vomiting, diarrhea, constipation, blood in stool and melena.  Genitourinary: Negative for dysuria, urgency,  frequency, hematuria and flank pain.  Musculoskeletal: Negative for myalgias, back pain, joint pain and falls.  Skin: Negative for itching and rash.  Neurological: Negative for dizziness, tingling, tremors, sensory change, speech change, focal weakness, seizures, loss of consciousness, weakness and headaches.  Endo/Heme/Allergies: Negative for environmental allergies and polydipsia. Does not bruise/bleed easily.  Psychiatric/Behavioral: Negative for depression, suicidal ideas, hallucinations, memory loss and substance abuse. The patient is not nervous/anxious and does not have insomnia.        Objective:  Blood pressure 116/79, pulse 90, height 5\' 2"  (1.575 m), weight 167 lb (75.8 kg).   Physical Exam  Vitals reviewed. Constitutional: She is oriented to person, place, and time. She appears well-developed and well-nourished.  HENT:  Head: Normocephalic and atraumatic.        Right Ear: External ear normal.  Left Ear: External ear normal.  Nose: Nose normal.  Mouth/Throat: Oropharynx is clear and moist.  Eyes: Conjunctivae and EOM are normal. Pupils are equal, round, and reactive to light. Right eye exhibits no discharge. Left eye exhibits no discharge. No scleral icterus.  Neck: Normal range of motion. Neck supple. No tracheal deviation present. No thyromegaly present.  Cardiovascular: Normal rate, regular rhythm, normal heart sounds and intact distal pulses.  Exam reveals no gallop and no friction rub.   No murmur heard. Respiratory: Effort normal and breath sounds normal. No respiratory distress. She has no wheezes. She has no rales. She exhibits no tenderness.  GI: Soft. Bowel sounds are normal. She exhibits no distension and no mass. There is no tenderness. There is no rebound and no guarding.  Genitourinary:  Breasts no masses skin changes or nipple changes bilaterally      Vulva is normal without lesions Vagina is pink moist without discharge Cervix normal in appearance and pap is  done Uterus is normal size shape and contour Adnexa is negative with normal sized ovaries   Musculoskeletal: Normal range of motion. She exhibits no edema and no tenderness.  Neurological: She is alert and oriented to person, place, and time. She has normal reflexes. She displays normal reflexes. No cranial nerve deficit. She exhibits normal muscle tone. Coordination normal.  Skin: Skin is warm and dry. No rash noted. No erythema. No pallor.  Psychiatric: She has a normal mood and affect. Her behavior is normal. Judgment and thought content normal.       Medications Ordered at today's visit: No orders of the defined types were placed in this encounter.   Other orders placed at today's visit: No orders of the defined types were placed in this encounter.     Assessment:    Normal Gyn exam.    Plan:  Contraception: tubal ligation.    Routine follow up 3 years if Pap is normal No follow-ups on file.

## 2020-03-11 LAB — CYTOLOGY - PAP
Chlamydia: NEGATIVE
Comment: NEGATIVE
Comment: NEGATIVE
Comment: NORMAL
Diagnosis: NEGATIVE
High risk HPV: NEGATIVE
Neisseria Gonorrhea: NEGATIVE

## 2020-03-22 ENCOUNTER — Telehealth: Payer: Self-pay | Admitting: Internal Medicine

## 2020-03-22 ENCOUNTER — Other Ambulatory Visit: Payer: Medicaid Other | Admitting: Adult Health

## 2020-03-22 NOTE — Telephone Encounter (Signed)
Patient called she states she had a ultrasound done last week and hasnt heard anything about results p# [phone]

## 2020-03-23 NOTE — Telephone Encounter (Signed)
Pt called wanting to know lab results. Please advise.

## 2020-03-23 NOTE — Telephone Encounter (Signed)
Called and discussed.  Thanks. 

## 2020-03-31 DIAGNOSIS — Z419 Encounter for procedure for purposes other than remedying health state, unspecified: Secondary | ICD-10-CM | POA: Diagnosis not present

## 2020-05-01 DIAGNOSIS — Z419 Encounter for procedure for purposes other than remedying health state, unspecified: Secondary | ICD-10-CM | POA: Diagnosis not present

## 2020-06-01 DIAGNOSIS — Z419 Encounter for procedure for purposes other than remedying health state, unspecified: Secondary | ICD-10-CM | POA: Diagnosis not present

## 2020-06-22 ENCOUNTER — Ambulatory Visit: Payer: Medicaid Other | Admitting: Internal Medicine

## 2020-06-28 ENCOUNTER — Telehealth: Payer: Self-pay

## 2020-06-28 ENCOUNTER — Other Ambulatory Visit: Payer: Self-pay | Admitting: Nurse Practitioner

## 2020-06-28 ENCOUNTER — Encounter: Payer: Self-pay | Admitting: Nurse Practitioner

## 2020-06-28 ENCOUNTER — Other Ambulatory Visit: Payer: Self-pay

## 2020-06-28 ENCOUNTER — Ambulatory Visit (INDEPENDENT_AMBULATORY_CARE_PROVIDER_SITE_OTHER): Payer: Medicaid Other | Admitting: Nurse Practitioner

## 2020-06-28 DIAGNOSIS — I10 Essential (primary) hypertension: Secondary | ICD-10-CM | POA: Diagnosis not present

## 2020-06-28 DIAGNOSIS — K649 Unspecified hemorrhoids: Secondary | ICD-10-CM | POA: Diagnosis not present

## 2020-06-28 MED ORDER — HYDROCORTISONE (PERIANAL) 2.5 % EX CREA: 1.0000 "application " | TOPICAL_CREAM | Freq: Two times a day (BID) | CUTANEOUS | 0 refills | Status: DC

## 2020-06-28 MED ORDER — HYDROCORTISONE (PERIANAL) 2.5 % EX CREA
1.0000 | TOPICAL_CREAM | Freq: Two times a day (BID) | CUTANEOUS | 0 refills | Status: AC
Start: 2020-06-28 — End: ?

## 2020-06-28 NOTE — Telephone Encounter (Signed)
The EMR shows that the anusol cream went through to [facility]. Just 1 medicine. She can keep using her lidocaine cream in addition to the anusol.

## 2020-06-28 NOTE — Assessment & Plan Note (Signed)
-  external with no evidence of thrombosis today -has skin tags -referral to GI for removal -Rx. anusol

## 2020-06-28 NOTE — Assessment & Plan Note (Signed)
-  BP slightly elevated today -she hasn't taken her lisinopril and is in pain from hemorrhoids -recheck in 3 months

## 2020-06-28 NOTE — Patient Instructions (Signed)

## 2020-06-28 NOTE — Progress Notes (Signed)
Acute Office Visit  Subjective:    Patient ID: Susan Valdez, female    DOB: 1985-09-24, 35 y.o.   MRN: [id number]  Chief Complaint  Patient presents with  . Hypertension  . Hemorrhoids    X 1 month; external, painful     HPI Patient is in today for blood pressure elevation and painful hemorrhoids.  She states that she has been using hemorrhoid cream, and her pain is getting worse. She rates her pain at 7/10, and she states it is occasional. She has been using prep H with lidocaine.  Past Medical History:  Diagnosis Date  . Chlamydia   . Hypertension   . Increased serum lipids   . Pre-diabetes   . Toxemia in pregnancy    resolved    Past Surgical History:  Procedure Laterality Date  . CESAREAN SECTION     x1  . FIBULA FRACTURE SURGERY Right   . LAPAROSCOPIC BILATERAL SALPINGECTOMY Bilateral 04/12/2016   Procedure: LAPAROSCOPIC BILATERAL SALPINGECTOMY;  Surgeon: Lazaro Arms, MD;  Location: [facility];  Service: Gynecology;  Laterality: Bilateral;  . LYSIS OF ADHESION N/A 04/12/2016   Procedure: LYSIS OF ADHESION;  Surgeon: Lazaro Arms, MD;  Location: [facility];  Service: Gynecology;  Laterality: N/A;    Family History  Problem Relation Age of Onset  . Hypertension Mother   . Diabetes Mother   . Gestational diabetes Other     Social History   Socioeconomic History  . Marital status: Single    Spouse name: Not on file  . Number of children: Not on file  . Years of education: Not on file  . Highest education level: Not on file  Occupational History  . Not on file  Tobacco Use  . Smoking status: Light Tobacco Smoker  . Smokeless tobacco: Never Used  Vaping Use  . Vaping Use: Every day  Substance and Sexual Activity  . Alcohol use: Yes    Comment: once a month  . Drug use: No  . Sexual activity: Yes    Birth control/protection: Pill  Other Topics Concern  . Not on file  Social History Narrative  . Not on file   Social Determinants of Health   Financial  Resource Strain: Low Risk   . Difficulty of Paying Living Expenses: Not hard at all  Food Insecurity: No Food Insecurity  . Worried About Programme researcher, broadcasting/film/video in the Last Year: Never true  . Ran Out of Food in the Last Year: Never true  Transportation Needs: No Transportation Needs  . Lack of Transportation (Medical): No  . Lack of Transportation (Non-Medical): No  Physical Activity: Insufficiently Active  . Days of Exercise per Week: 2 days  . Minutes of Exercise per Session: 30 min  Stress: No Stress Concern Present  . Feeling of Stress : Not at all  Social Connections: Moderately Isolated  . Frequency of Communication with Friends and Family: More than three times a week  . Frequency of Social Gatherings with Friends and Family: Once a week  . Attends Religious Services: More than 4 times per year  . Active Member of Clubs or Organizations: No  . Attends Banker Meetings: Never  . Marital Status: Never married  Intimate Partner Violence: Not At Risk  . Fear of Current or Ex-Partner: No  . Emotionally Abused: No  . Physically Abused: No  . Sexually Abused: No    Outpatient Medications Prior to Visit  Medication Sig Dispense Refill  .  lisinopril (ZESTRIL) 10 MG tablet Take 1 tablet (10 mg total) by mouth daily. 90 tablet 1   No facility-administered medications prior to visit.    Allergies  Allergen Reactions  . Penicillins Hives    Has patient had a PCN reaction causing immediate rash, facial/tongue/throat swelling, SOB or lightheadedness with hypotension:unsure Has patient had a PCN reaction causing severe rash involving mucus membranes or skin necrosis:unsure Has patient had a PCN reaction that required hospitalization:No Has patient had a PCN reaction occurring within the last 10 years:No If all of the above answers are "NO", then may proceed with Cephalosporin use. Childhood reaction    Review of Systems  Constitutional: Negative.   Respiratory:  Negative.   Cardiovascular: Negative.   Gastrointestinal:       Hemorroids       Objective:    Physical Exam Exam conducted with a chaperone present Morrie Sheldon C, LPN).  Constitutional:      Appearance: Normal appearance.  Cardiovascular:     Rate and Rhythm: Normal rate and regular rhythm.     Pulses: Normal pulses.     Heart sounds: Normal heart sounds.  Pulmonary:     Effort: Pulmonary effort is normal.     Breath sounds: Normal breath sounds.  Abdominal:     Comments: Hemorrhoids with skin tags visible, no evidence of thrombosis  Neurological:     Mental Status: She is alert.     BP (!) 139/96   Pulse 90   Temp 98.5 F (36.9 C)   Resp 18   Ht 5\' 2"  (1.575 m)   Wt 172 lb (78 kg)   SpO2 100%   BMI 31.46 kg/m  Wt Readings from Last 3 Encounters:  06/28/20 172 lb (78 kg)  03/08/20 167 lb (75.8 kg)  02/16/20 171 lb 1.9 oz (77.6 kg)    Health Maintenance Due  Topic Date Due  . Hepatitis C Screening  Never done  . COVID-19 Vaccine (1) Never done  . HIV Screening  Never done  . TETANUS/TDAP  Never done    There are no preventive care reminders to display for this patient.   Lab Results  Component Value Date   TSH 0.842 01/26/2020   Lab Results  Component Value Date   WBC 4.5 01/26/2020   HGB 13.9 01/26/2020   HCT 42.3 01/26/2020   MCV 93 01/26/2020   PLT 311 01/26/2020   Lab Results  Component Value Date   NA 141 01/26/2020   K 4.0 01/26/2020   CO2 27 01/26/2020   GLUCOSE 99 01/26/2020   BUN 7 01/26/2020   CREATININE 0.58 01/26/2020   BILITOT 0.3 01/26/2020   ALKPHOS 69 01/26/2020   AST 84 (H) 01/26/2020   ALT 98 (H) 01/26/2020   PROT 7.4 01/26/2020   ALBUMIN 4.6 01/26/2020   CALCIUM 9.8 01/26/2020   ANIONGAP 9 04/05/2016   Lab Results  Component Value Date   CHOL 235 (H) 01/26/2020   Lab Results  Component Value Date   HDL 35 (L) 01/26/2020   Lab Results  Component Value Date   LDLCALC 172 (H) 01/26/2020   Lab Results   Component Value Date   TRIG 150 (H) 01/26/2020   Lab Results  Component Value Date   CHOLHDL 6.7 (H) 01/26/2020   No results found for: HGBA1C     Assessment & Plan:   Problem List Items Addressed This Visit      Cardiovascular and Mediastinum   Hypertension    -  BP slightly elevated today -she hasn't taken her lisinopril and is in pain from hemorrhoids -recheck in 3 months      Hemorrhoid    -external with no evidence of thrombosis today -has skin tags -referral to GI for removal -Rx. anusol      Relevant Medications   hydrocortisone (ANUSOL-HC) 2.5 % rectal cream   Other Relevant Orders   Ambulatory referral to Gastroenterology       Meds ordered this encounter  Medications  . hydrocortisone (ANUSOL-HC) 2.5 % rectal cream    Sig: Place 1 application rectally 2 (two) times daily.    Dispense:  30 g    Refill:  0     Heather Roberts, NP

## 2020-06-28 NOTE — Telephone Encounter (Signed)
Pt informed

## 2020-06-28 NOTE — Telephone Encounter (Signed)
Pt called stating she was under the impression she would have 2 Rx's at the pharmacy and they havent't received any. Uses [facility]. Please advise.

## 2020-06-29 ENCOUNTER — Encounter: Payer: Self-pay | Admitting: Internal Medicine

## 2020-06-29 DIAGNOSIS — Z419 Encounter for procedure for purposes other than remedying health state, unspecified: Secondary | ICD-10-CM | POA: Diagnosis not present

## 2020-07-30 DIAGNOSIS — Z419 Encounter for procedure for purposes other than remedying health state, unspecified: Secondary | ICD-10-CM | POA: Diagnosis not present

## 2020-08-05 ENCOUNTER — Ambulatory Visit: Payer: Medicaid Other | Admitting: Gastroenterology

## 2020-08-24 ENCOUNTER — Other Ambulatory Visit: Payer: Self-pay | Admitting: *Deleted

## 2020-08-24 ENCOUNTER — Telehealth: Payer: Self-pay

## 2020-08-24 DIAGNOSIS — I10 Essential (primary) hypertension: Secondary | ICD-10-CM

## 2020-08-24 MED ORDER — LISINOPRIL 10 MG PO TABS: 10.0000 mg | ORAL_TABLET | Freq: Every day | ORAL | 1 refills | Status: DC

## 2020-08-24 NOTE — Telephone Encounter (Signed)
Medication sent to pharmacy  

## 2020-08-24 NOTE — Telephone Encounter (Signed)
Pt needs Lisinopril called in

## 2020-08-26 ENCOUNTER — Other Ambulatory Visit: Payer: Self-pay

## 2020-08-26 ENCOUNTER — Encounter: Payer: Self-pay | Admitting: Nurse Practitioner

## 2020-08-26 ENCOUNTER — Ambulatory Visit (INDEPENDENT_AMBULATORY_CARE_PROVIDER_SITE_OTHER): Payer: Medicaid Other | Admitting: Nurse Practitioner

## 2020-08-26 DIAGNOSIS — R5383 Other fatigue: Secondary | ICD-10-CM | POA: Diagnosis not present

## 2020-08-26 DIAGNOSIS — N898 Other specified noninflammatory disorders of vagina: Secondary | ICD-10-CM

## 2020-08-26 LAB — POCT URINALYSIS DIPSTICK
Bilirubin, UA: NEGATIVE
Glucose, UA: NEGATIVE
Ketones, UA: NEGATIVE
Leukocytes, UA: NEGATIVE
Nitrite, UA: NEGATIVE
Protein, UA: NEGATIVE
Spec Grav, UA: 1.025 (ref 1.010–1.025)
Urobilinogen, UA: 0.2 E.U./dL
pH, UA: 6 (ref 5.0–8.0)

## 2020-08-26 MED ORDER — METRONIDAZOLE 0.75 % VA GEL: 1.0000 | Freq: Two times a day (BID) | VAGINAL | 0 refills | Status: DC

## 2020-08-26 NOTE — Assessment & Plan Note (Signed)
-  NuSwab completed today -pt reports thick, white discharge, and she is unsure if there is an odor; denies itching -Rx. flagyl

## 2020-08-26 NOTE — Patient Instructions (Signed)
Please have labs drawn today

## 2020-08-26 NOTE — Progress Notes (Signed)
Acute Office Visit  Subjective:    Patient ID: Susan Valdez, female    DOB: 1985-06-02, 35 y.o.   MRN: [id number]  Chief Complaint  Patient presents with  . Vaginal Discharge  . Fatigue    X 2 months intermittently   . Nausea    HPI Patient is in today for vaginal discharge, fatigue, and nausea.  She noticed the vaginal discharge that is white and has been ongoing for a week.  She states she has nausea near her period.  Her LMP was 07/30/20, and she states her nausea generally occurs just before her period starts.  She has had fatigue for about 2 months.  She states she has had no energy since changing jobs.  Past Medical History:  Diagnosis Date  . Chlamydia   . Hypertension   . Increased serum lipids   . Pre-diabetes   . Toxemia in pregnancy    resolved    Past Surgical History:  Procedure Laterality Date  . CESAREAN SECTION     x1  . FIBULA FRACTURE SURGERY Right   . LAPAROSCOPIC BILATERAL SALPINGECTOMY Bilateral 04/12/2016   Procedure: LAPAROSCOPIC BILATERAL SALPINGECTOMY;  Surgeon: Lazaro Arms, MD;  Location: [facility];  Service: Gynecology;  Laterality: Bilateral;  . LYSIS OF ADHESION N/A 04/12/2016   Procedure: LYSIS OF ADHESION;  Surgeon: Lazaro Arms, MD;  Location: [facility];  Service: Gynecology;  Laterality: N/A;    Family History  Problem Relation Age of Onset  . Hypertension Mother   . Diabetes Mother   . Gestational diabetes Other     Social History   Socioeconomic History  . Marital status: Single    Spouse name: Not on file  . Number of children: Not on file  . Years of education: Not on file  . Highest education level: Not on file  Occupational History  . Not on file  Tobacco Use  . Smoking status: Light Tobacco Smoker  . Smokeless tobacco: Never Used  Vaping Use  . Vaping Use: Every day  Substance and Sexual Activity  . Alcohol use: Yes    Comment: once a month  . Drug use: No  . Sexual activity: Yes    Birth control/protection:  Pill  Other Topics Concern  . Not on file  Social History Narrative  . Not on file   Social Determinants of Health   Financial Resource Strain: Low Risk   . Difficulty of Paying Living Expenses: Not hard at all  Food Insecurity: No Food Insecurity  . Worried About Programme researcher, broadcasting/film/video in the Last Year: Never true  . Ran Out of Food in the Last Year: Never true  Transportation Needs: No Transportation Needs  . Lack of Transportation (Medical): No  . Lack of Transportation (Non-Medical): No  Physical Activity: Insufficiently Active  . Days of Exercise per Week: 2 days  . Minutes of Exercise per Session: 30 min  Stress: No Stress Concern Present  . Feeling of Stress : Not at all  Social Connections: Moderately Isolated  . Frequency of Communication with Friends and Family: More than three times a week  . Frequency of Social Gatherings with Friends and Family: Once a week  . Attends Religious Services: More than 4 times per year  . Active Member of Clubs or Organizations: No  . Attends Banker Meetings: Never  . Marital Status: Never married  Intimate Partner Violence: Not At Risk  . Fear of Current or Ex-Partner: No  .  Emotionally Abused: No  . Physically Abused: No  . Sexually Abused: No    Outpatient Medications Prior to Visit  Medication Sig Dispense Refill  . hydrocortisone (ANUSOL-HC) 2.5 % rectal cream Place 1 application rectally 2 (two) times daily. 30 g 0  . lisinopril (ZESTRIL) 10 MG tablet Take 1 tablet (10 mg total) by mouth daily. 90 tablet 1   No facility-administered medications prior to visit.    Allergies  Allergen Reactions  . Penicillins Hives    Has patient had a PCN reaction causing immediate rash, facial/tongue/throat swelling, SOB or lightheadedness with hypotension:unsure Has patient had a PCN reaction causing severe rash involving mucus membranes or skin necrosis:unsure Has patient had a PCN reaction that required  hospitalization:No Has patient had a PCN reaction occurring within the last 10 years:No If all of the above answers are "NO", then may proceed with Cephalosporin use. Childhood reaction    Review of Systems  Constitutional: Positive for fatigue.  Respiratory: Negative.   Cardiovascular: Negative.   Genitourinary: Positive for dyspareunia, pelvic pain, vaginal discharge and vaginal pain. Negative for dysuria.       Objective:    Physical Exam Constitutional:      Appearance: Normal appearance. She is obese.  Cardiovascular:     Rate and Rhythm: Normal rate and regular rhythm.     Pulses: Normal pulses.     Heart sounds: Normal heart sounds.  Pulmonary:     Effort: Pulmonary effort is normal.     Breath sounds: Normal breath sounds.  Abdominal:     General: There is no distension.     Palpations: There is no mass.     Tenderness: There is no abdominal tenderness. There is no rebound.     Hernia: No hernia is present.  Musculoskeletal:        General: Normal range of motion.  Neurological:     Mental Status: She is alert.  Psychiatric:        Mood and Affect: Mood normal.        Behavior: Behavior normal.        Thought Content: Thought content normal.        Judgment: Judgment normal.     BP 137/90   Pulse (!) 108   Temp 99.1 F (37.3 C)   Resp 18   Ht 5\' 2"  (1.575 m)   Wt 170 lb (77.1 kg)   SpO2 98%   BMI 31.09 kg/m  Wt Readings from Last 3 Encounters:  08/26/20 170 lb (77.1 kg)  06/28/20 172 lb (78 kg)  03/08/20 167 lb (75.8 kg)    Health Maintenance Due  Topic Date Due  . Hepatitis C Screening  Never done  . HIV Screening  Never done  . TETANUS/TDAP  Never done    There are no preventive care reminders to display for this patient.   Lab Results  Component Value Date   TSH 0.842 01/26/2020   Lab Results  Component Value Date   WBC 4.5 01/26/2020   HGB 13.9 01/26/2020   HCT 42.3 01/26/2020   MCV 93 01/26/2020   PLT 311 01/26/2020   Lab  Results  Component Value Date   NA 141 01/26/2020   K 4.0 01/26/2020   CO2 27 01/26/2020   GLUCOSE 99 01/26/2020   BUN 7 01/26/2020   CREATININE 0.58 01/26/2020   BILITOT 0.3 01/26/2020   ALKPHOS 69 01/26/2020   AST 84 (H) 01/26/2020   ALT 98 (H) 01/26/2020  PROT 7.4 01/26/2020   ALBUMIN 4.6 01/26/2020   CALCIUM 9.8 01/26/2020   ANIONGAP 9 04/05/2016   Lab Results  Component Value Date   CHOL 235 (H) 01/26/2020   Lab Results  Component Value Date   HDL 35 (L) 01/26/2020   Lab Results  Component Value Date   LDLCALC 172 (H) 01/26/2020   Lab Results  Component Value Date   TRIG 150 (H) 01/26/2020   Lab Results  Component Value Date   CHOLHDL 6.7 (H) 01/26/2020   No results found for: HGBA1C     Assessment & Plan:   Problem List Items Addressed This Visit      Other   Vaginal discharge    -NuSwab completed today -pt reports thick, white discharge, and she is unsure if there is an odor; denies itching -Rx. flagyl      Relevant Medications   metroNIDAZOLE (METROGEL) 0.75 % vaginal gel   Other Relevant Orders   NuSwab Vaginitis Plus (VG+)   Fatigue    -ongoing x 2 months since she changed jobs -will check CBC, BMP, and TSH  -urine pregnancy was negative      Relevant Orders   CBC with Differential/Platelet   Basic metabolic panel   TSH       Meds ordered this encounter  Medications  . metroNIDAZOLE (METROGEL) 0.75 % vaginal gel    Sig: Place 1 Applicatorful vaginally 2 (two) times daily.    Dispense:  70 g    Refill:  0     Heather Roberts, NP

## 2020-08-26 NOTE — Addendum Note (Signed)
Addended by: Dellia Cloud on: 08/26/2020 03:19 PM   Modules accepted: Orders

## 2020-08-26 NOTE — Assessment & Plan Note (Addendum)
-  ongoing x 2 months since she changed jobs -will check CBC, BMP, and TSH  -urine pregnancy was negative

## 2020-08-27 ENCOUNTER — Telehealth: Payer: Self-pay

## 2020-08-27 ENCOUNTER — Other Ambulatory Visit: Payer: Self-pay | Admitting: Internal Medicine

## 2020-08-27 DIAGNOSIS — N760 Acute vaginitis: Secondary | ICD-10-CM

## 2020-08-27 LAB — CBC WITH DIFFERENTIAL/PLATELET
Basophils Absolute: 0 10*3/uL (ref 0.0–0.2)
Basos: 0 %
EOS (ABSOLUTE): 0.1 10*3/uL (ref 0.0–0.4)
Eos: 1 %
Hematocrit: 39.7 % (ref 34.0–46.6)
Hemoglobin: 13.6 g/dL (ref 11.1–15.9)
Immature Grans (Abs): 0 10*3/uL (ref 0.0–0.1)
Immature Granulocytes: 0 %
Lymphocytes Absolute: 3.5 10*3/uL — ABNORMAL HIGH (ref 0.7–3.1)
Lymphs: 35 %
MCH: 31.9 pg (ref 26.6–33.0)
MCHC: 34.3 g/dL (ref 31.5–35.7)
MCV: 93 fL (ref 79–97)
Monocytes Absolute: 0.5 10*3/uL (ref 0.1–0.9)
Monocytes: 5 %
Neutrophils Absolute: 6 10*3/uL (ref 1.4–7.0)
Neutrophils: 59 %
Platelets: 324 10*3/uL (ref 150–450)
RBC: 4.26 x10E6/uL (ref 3.77–5.28)
RDW: 12 % (ref 11.7–15.4)
WBC: 10 10*3/uL (ref 3.4–10.8)

## 2020-08-27 LAB — BASIC METABOLIC PANEL
BUN/Creatinine Ratio: 13 (ref 9–23)
BUN: 8 mg/dL (ref 6–20)
CO2: 22 mmol/L (ref 20–29)
Calcium: 10 mg/dL (ref 8.7–10.2)
Chloride: 103 mmol/L (ref 96–106)
Creatinine, Ser: 0.63 mg/dL (ref 0.57–1.00)
Glucose: 94 mg/dL (ref 65–99)
Potassium: 4.5 mmol/L (ref 3.5–5.2)
Sodium: 140 mmol/L (ref 134–144)
eGFR: 120 mL/min/{1.73_m2} (ref >59)

## 2020-08-27 LAB — TSH: TSH: 1.01 u[IU]/mL (ref 0.450–4.500)

## 2020-08-27 MED ORDER — METRONIDAZOLE 500 MG PO TABS: 500.0000 mg | ORAL_TABLET | Freq: Two times a day (BID) | ORAL | 0 refills | Status: AC

## 2020-08-27 NOTE — Telephone Encounter (Signed)
Letter printed for pick up

## 2020-08-27 NOTE — Telephone Encounter (Signed)
Please provide work note under gray since he was the one that saw her pt notified med has been changed

## 2020-08-27 NOTE — Telephone Encounter (Signed)
Patient called can provider change the prescription does not like the metroNIDAZOLE (METROGEL) 0.75 % vaginal gel change to a tablet form instead of the gel.  Pharmacy: [facility]    Patient asking need her out of work note changed to today because she used the gel last night and it kinda irritated her last night and she did not go to work.  Patient call back # [phone]

## 2020-08-27 NOTE — Progress Notes (Signed)
Her labs are great. No sign of infection or thyroid disorder.

## 2020-08-27 NOTE — Telephone Encounter (Signed)
Sent Metronidazole tablets for now. Please provide her work note. She was seen by Casimiro Needle yesterday. So please make necessary changes. Thank you.

## 2020-08-29 DIAGNOSIS — Z419 Encounter for procedure for purposes other than remedying health state, unspecified: Secondary | ICD-10-CM | POA: Diagnosis not present

## 2020-08-30 LAB — NUSWAB VAGINITIS PLUS (VG+)
Atopobium vaginae: HIGH Score — AB
Candida albicans, NAA: NEGATIVE
Candida glabrata, NAA: NEGATIVE
Chlamydia trachomatis, NAA: NEGATIVE
Megasphaera 1: HIGH Score — AB
Neisseria gonorrhoeae, NAA: NEGATIVE
Trich vag by NAA: NEGATIVE

## 2020-08-30 NOTE — Progress Notes (Signed)
The swab just resulted, and it was positive for BV, but we prescribed her flagyl, so no need for a new Rx.

## 2020-08-31 ENCOUNTER — Ambulatory Visit: Payer: Medicaid Other | Admitting: Nurse Practitioner

## 2020-09-01 ENCOUNTER — Other Ambulatory Visit: Payer: Medicaid Other

## 2020-09-22 ENCOUNTER — Ambulatory Visit (INDEPENDENT_AMBULATORY_CARE_PROVIDER_SITE_OTHER): Payer: Medicaid Other | Admitting: Internal Medicine

## 2020-09-22 ENCOUNTER — Other Ambulatory Visit: Payer: Self-pay

## 2020-09-22 ENCOUNTER — Encounter: Payer: Self-pay | Admitting: Internal Medicine

## 2020-09-22 VITALS — BP 128/81 | HR 96 | Temp 98.2°F | Resp 18 | Ht 62.0 in | Wt 171.1 lb

## 2020-09-22 DIAGNOSIS — Z1159 Encounter for screening for other viral diseases: Secondary | ICD-10-CM | POA: Diagnosis not present

## 2020-09-22 DIAGNOSIS — R5382 Chronic fatigue, unspecified: Secondary | ICD-10-CM

## 2020-09-22 DIAGNOSIS — E785 Hyperlipidemia, unspecified: Secondary | ICD-10-CM

## 2020-09-22 DIAGNOSIS — I10 Essential (primary) hypertension: Secondary | ICD-10-CM | POA: Diagnosis not present

## 2020-09-22 DIAGNOSIS — Z114 Encounter for screening for human immunodeficiency virus [HIV]: Secondary | ICD-10-CM

## 2020-09-22 MED ORDER — DULOXETINE HCL 30 MG PO CPEP: 30.0000 mg | ORAL_CAPSULE | Freq: Every day | ORAL | 2 refills | Status: DC

## 2020-09-22 NOTE — Assessment & Plan Note (Signed)
BP Readings from Last 1 Encounters:  09/22/20 128/81   Well-controlled with Lisinopril Counseled for compliance with the medications Advised DASH diet and moderate exercise/walking, at least 150 mins/week

## 2020-09-22 NOTE — Assessment & Plan Note (Signed)
Chronic fatigue, could be multifactorial Had COVID infection few months ago Has been changing jobs recently, which has been stressful for her Also reports mild anhedonia and anger bursts at times Will give a trial of Cymbalta

## 2020-09-22 NOTE — Progress Notes (Signed)
Established Patient Office Visit  Subjective:  Patient ID: Susan Valdez, female    DOB: 09-Jul-1985  Age: 35 y.o. MRN: [id number]  CC:  Chief Complaint  Patient presents with  . Follow-up    3 month follow up     HPI Susan Valdez is a 35 year old female with PMH of HTN and HLD who presents for follow up of HTN and review of her blood tests.  HTN: BP is well-controlled. Takes medications regularly. Patient denies headache, dizziness, chest pain, dyspnea or palpitations.  She has been trying to follow low salt and low cholesterol diet, but states that she can still improve her diet.  She continues to have fatigue, worst in the morning. She reports that she has been changing job for last few months, which has been stressful for her. 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 She also reports occasional anger bursts at work. Also reports anhedonia at times. Denies any SI or HI. Of note, she also had COVID few months ago.  Past Medical History:  Diagnosis Date  . Chlamydia   . Hypertension   . Increased serum lipids   . Pre-diabetes   . Toxemia in pregnancy    resolved    Past Surgical History:  Procedure Laterality Date  . CESAREAN SECTION     x1  . FIBULA FRACTURE SURGERY Right   . LAPAROSCOPIC BILATERAL SALPINGECTOMY Bilateral 04/12/2016   Procedure: LAPAROSCOPIC BILATERAL SALPINGECTOMY;  Surgeon: Florian Buff, MD;  Location: [facility];  Service: Gynecology;  Laterality: Bilateral;  . LYSIS OF ADHESION N/A 04/12/2016   Procedure: LYSIS OF ADHESION;  Surgeon: Florian Buff, MD;  Location: [facility];  Service: Gynecology;  Laterality: N/A;    Family History  Problem Relation Age of Onset  . Hypertension Mother   . Diabetes Mother   . Gestational diabetes Other     Social History   Socioeconomic History  . Marital status: Single    Spouse name: Not on file  . Number of children: Not on file  . Years of education: Not on file  . Highest  education level: Not on file  Occupational History  . Not on file  Tobacco Use  . Smoking status: Light Tobacco Smoker  . Smokeless tobacco: Never Used  Vaping Use  . Vaping Use: Every day  Substance and Sexual Activity  . Alcohol use: Yes    Comment: once a month  . Drug use: No  . Sexual activity: Yes    Birth control/protection: Pill  Other Topics Concern  . Not on file  Social History Narrative  . Not on file   Social Determinants of Health   Financial Resource Strain: Low Risk   . Difficulty of Paying Living Expenses: Not hard at all  Food Insecurity: No Food Insecurity  . Worried About Charity fundraiser in the Last Year: Never true  . Ran Out of Food in the Last Year: Never true  Transportation Needs: No Transportation Needs  . Lack of Transportation (Medical): No  . Lack of Transportation (Non-Medical): No  Physical Activity: Insufficiently Active  . Days of Exercise per Week: 2 days  . Minutes of Exercise per Session: 30 min  Stress: No Stress Concern Present  . Feeling of Stress : Not at all  Social Connections: Moderately Isolated  . Frequency of Communication with Friends and Family: More than three times a week  . Frequency of Social Gatherings with  Friends and Family: Once a week  . Attends Religious Services: More than 4 times per year  . Active Member of Clubs or Organizations: No  . Attends Archivist Meetings: Never  . Marital Status: Never married  Intimate Partner Violence: Not At Risk  . Fear of Current or Ex-Partner: No  . Emotionally Abused: No  . Physically Abused: No  . Sexually Abused: No    Outpatient Medications Prior to Visit  Medication Sig Dispense Refill  . hydrocortisone (ANUSOL-HC) 2.5 % rectal cream Place 1 application rectally 2 (two) times daily. 30 g 0  . lisinopril (ZESTRIL) 10 MG tablet Take 1 tablet (10 mg total) by mouth daily. 90 tablet 1   No facility-administered medications prior to visit.    Allergies   Allergen Reactions  . Penicillins Hives    Has patient had a PCN reaction causing immediate rash, facial/tongue/throat swelling, SOB or lightheadedness with hypotension:unsure Has patient had a PCN reaction causing severe rash involving mucus membranes or skin necrosis:unsure Has patient had a PCN reaction that required hospitalization:No Has patient had a PCN reaction occurring within the last 10 years:No If all of the above answers are "NO", then may proceed with Cephalosporin use. Childhood reaction    ROS Review of Systems  Constitutional: Positive for fatigue. Negative for chills and fever.  HENT: Negative for congestion, sinus pressure, sinus pain and sore throat.   Eyes: Negative for pain and discharge.  Respiratory: Negative for cough and shortness of breath.   Cardiovascular: Negative for chest pain and palpitations.  Gastrointestinal: Negative for abdominal pain, constipation, diarrhea, nausea and vomiting.  Endocrine: Negative for polydipsia and polyuria.  Genitourinary: Negative for dysuria and hematuria.  Musculoskeletal: Negative for neck pain and neck stiffness.  Skin: Negative for rash.  Neurological: Negative for dizziness, syncope, weakness and headaches.  Psychiatric/Behavioral: Positive for dysphoric mood. Negative for agitation and behavioral problems. The patient is nervous/anxious.       Objective:    Physical Exam Vitals reviewed.  Constitutional:      General: She is not in acute distress.    Appearance: She is not diaphoretic.  HENT:     Head: Normocephalic and atraumatic.     Nose: Nose normal.     Mouth/Throat:     Mouth: Mucous membranes are moist.  Eyes:     General: No scleral icterus.    Extraocular Movements: Extraocular movements intact.  Cardiovascular:     Rate and Rhythm: Normal rate and regular rhythm.     Pulses: Normal pulses.     Heart sounds: Normal heart sounds. No murmur heard.   Pulmonary:     Breath sounds: Normal breath  sounds. No wheezing or rales.  Abdominal:     General: There is no distension.     Palpations: Abdomen is soft.     Tenderness: There is no abdominal tenderness.  Musculoskeletal:     Cervical back: Neck supple. No tenderness.     Right lower leg: No edema.     Left lower leg: No edema.  Skin:    General: Skin is warm.     Findings: No rash.  Neurological:     General: No focal deficit present.     Mental Status: She is alert and oriented to person, place, and time.     Sensory: No sensory deficit.     Motor: No weakness.  Psychiatric:        Mood and Affect: Mood normal.  Behavior: Behavior normal.     BP 128/81 (BP Location: Left Arm, Patient Position: Sitting, Cuff Size: Normal)   Pulse 96   Temp 98.2 F (36.8 C) (Oral)   Resp 18   Ht 5' 2"  (1.575 m)   Wt 171 lb 1.9 oz (77.6 kg)   SpO2 100%   BMI 31.30 kg/m  Wt Readings from Last 3 Encounters:  09/22/20 171 lb 1.9 oz (77.6 kg)  08/26/20 170 lb (77.1 kg)  06/28/20 172 lb (78 kg)     Health Maintenance Due  Topic Date Due  . COVID-19 Vaccine (1) Never done  . Hepatitis C Screening  Never done  . TETANUS/TDAP  Never done    There are no preventive care reminders to display for this patient.  Lab Results  Component Value Date   TSH 1.010 08/26/2020   Lab Results  Component Value Date   WBC 10.0 08/26/2020   HGB 13.6 08/26/2020   HCT 39.7 08/26/2020   MCV 93 08/26/2020   PLT 324 08/26/2020   Lab Results  Component Value Date   NA 140 08/26/2020   K 4.5 08/26/2020   CO2 22 08/26/2020   GLUCOSE 94 08/26/2020   BUN 8 08/26/2020   CREATININE 0.63 08/26/2020   BILITOT 0.3 01/26/2020   ALKPHOS 69 01/26/2020   AST 84 (H) 01/26/2020   ALT 98 (H) 01/26/2020   PROT 7.4 01/26/2020   ALBUMIN 4.6 01/26/2020   CALCIUM 10.0 08/26/2020   ANIONGAP 9 04/05/2016   EGFR 120 08/26/2020   Lab Results  Component Value Date   CHOL 235 (H) 01/26/2020   Lab Results  Component Value Date   HDL 35 (L)  01/26/2020   Lab Results  Component Value Date   LDLCALC 172 (H) 01/26/2020   Lab Results  Component Value Date   TRIG 150 (H) 01/26/2020   Lab Results  Component Value Date   CHOLHDL 6.7 (H) 01/26/2020   No results found for: HGBA1C    Assessment & Plan:   Problem List Items Addressed This Visit      Cardiovascular and Mediastinum   Hypertension - Primary    BP Readings from Last 1 Encounters:  09/22/20 128/81   Well-controlled with Lisinopril Counseled for compliance with the medications Advised DASH diet and moderate exercise/walking, at least 150 mins/week       Relevant Orders   CBC with Differential/Platelet   CMP14+EGFR   Hemoglobin A1c     Other   Hyperlipidemia    Lipid profile reviewed Needs to strictly follow low cholesterol diet If persistently elevated, will add statin      Relevant Orders   Hemoglobin A1c   Lipid panel   Fatigue    Chronic fatigue, could be multifactorial Had COVID infection few months ago Has been changing jobs recently, which has been stressful for her Also reports mild anhedonia and anger bursts at times Will give a trial of Cymbalta      Relevant Medications   DULoxetine (CYMBALTA) 30 MG capsule   Other Relevant Orders   Cortisol   TSH + free T4   Vitamin D (25 hydroxy)    Other Visit Diagnoses    Need for hepatitis C screening test       Relevant Orders   Hepatitis C Antibody   Encounter for screening for HIV       Relevant Orders   HIV antibody (with reflex)      Meds ordered this encounter  Medications  .  DULoxetine (CYMBALTA) 30 MG capsule    Sig: Take 1 capsule (30 mg total) by mouth daily.    Dispense:  30 capsule    Refill:  2    Follow-up: Return in about 4 months (around 01/23/2021) for Annual physical.    Lindell Spar, MD

## 2020-09-22 NOTE — Assessment & Plan Note (Signed)
Lipid profile reviewed °Needs to strictly follow low cholesterol diet °If persistently elevated, will add statin °

## 2020-09-22 NOTE — Patient Instructions (Signed)
Chronic Fatigue Syndrome Chronic fatigue syndrome (CFS) is a condition that causes extreme tiredness (fatigue). This condition is also known as myalgic encephalomyelitis (ME). The fatigue in CFS does not improve with rest, and it gets worse with physical or mental activity. Several other symptoms may occur along with fatigue. Symptoms may come and go, but they generally last for months. Sometimes, CFS gets better over time. In other cases, it can be a lifelong condition. There is no cure, but there are many possible treatments. You will need to work with your health care providers to find a treatment plan that works best for you. What are the causes? The cause of this condition is not known. CFS may be caused by a combination of things. Possible causes include:  An infection.  An abnormal body defense system (abnormal immune system).  Low blood pressure.  Poor diet.  Living with a lot of physical or emotional stress. What increases the risk? The following factors may make you more likely to develop this condition:  Being female.  Being 9-86 years old.  Having a family history of CFS. What are the signs or symptoms? The main symptom of this condition is fatigue that is severe enough to interfere with day-to-day activities. This fatigue does not get better with rest, and it gets worse with physical or mental activity. There are eight other major symptoms of CFS:  Discomfort and lack of energy (malaise) that lasts more than 24 hours after physical activity.  Sleep that does not relieve fatigue (unrefreshing sleep).  Short-term memory loss or confusion.  Joint pain without redness or swelling.  Muscle aches.  Headaches.  Painful and swollen glands (lymph nodes) in the neck or under the arms.  Sore throat. Other symptoms can include:  Cramps in the abdomen, constipation, or diarrhea (irritable bowel syndrome).  Chills and night sweats.  Vision changes.  Dizziness and  mental confusion (brain fog).  Clumsiness.  Sensitivity to food, noise, or odors.  Mood swings, depression, or anxiety attacks. How is this diagnosed? There are no tests that can diagnose this condition. Your health care provider will make the diagnosis based on:  Your symptoms and medical history.  A physical exam and a mental health exam.  Tests to rule out other conditions. It is important to make sure that your symptoms are not caused by another medical condition. Tests may include lab tests or X-rays. For your health care provider to diagnose CFS:  You must have had fatigue for at least 6 straight months.  Fatigue must be your first symptom, and it must be severe enough to interfere with day-to-day activities.  You must also have at least four of the eight other major symptoms of CFS.  There must be no other cause found for the fatigue. How is this treated? There is no cure for CFS. The condition affects everyone differently. You will need to work with your team of health care providers to find the best treatments for your symptoms. Your team may include your primary care provider, physical and exercise therapists, and mental health therapists. Treatment may include:  Having a regular bedtime routine to help improve your sleep.  Avoiding caffeine, alcohol, and tobacco or nicotine products.  Doing light exercise and stretching during the day. You may also want to try movement exercises, such as yoga or tai chi.  Taking medicines to help you sleep or to relieve joint or muscle pain.  Learning and practicing relaxation techniques, such as deep breathing and  muscle relaxation.  Using memory aids or doing brainteasers to improve memory and concentration.  Getting care for your body and mental well-being, such as: ? Seeing a mental health therapist to evaluate and treat depression, if necessary. ? Cognitive behavioral therapy (CBT). This therapy changes the way you think or  act in response to the fatigue. This may help improve how you feel. ? Trying massage therapy and acupuncture.   Follow these instructions at home: Eating and drinking  Avoid caffeine and alcohol.  Avoid heavy meals in the evening.  Eat a healthy diet that includes foods such as vegetables, fruits, fish, and lean meats.   Activity  Rest as told by your health care provider.  Avoid fatigue by pacing yourself during the day and getting enough sleep at night.  Exercise regularly, as told by your health care provider.  Go to bed and get up at the same time every day. Lifestyle  Ask your health care provider whether you should keep a diary. Your health care provider will tell you what information to write in the diary. This may include when you have fatigue and how medicines and other behaviors or treatments help to reduce the fatigue.  Consider joining a CFS support group.  Avoid stress and use stress-reducing techniques that you learn in therapy. General instructions  Take over-the-counter and prescription medicines only as told by your health care provider.  Do not use herbal or dietary supplements unless they are approved by your health care provider.  Maintain a healthy weight.  Do not use any products that contain nicotine or tobacco, such as cigarettes, e-cigarettes, and chewing tobacco. If you need help quitting, ask your health care provider.  Keep all follow-up visits as told by your health care provider. This is important.   Where to find more information Get more information or find a support group near you at one of these links:  American Myalgic Encephalomyelitis and Chronic Fatigue Syndrome Society: ammes.org  Centers for Disease Control and Prevention: http://www.wolf.info/ Contact a health care provider if:  Your symptoms do not get better or they get worse.  You feel angry, guilty, anxious, or depressed. Get help right away if:  You have thoughts of self-harm. If  you ever feel like you may hurt yourself or others, or have thoughts about taking your own life, get help right away. Go to your nearest emergency department or:  Call your local emergency services (911 in the [place]  Call a suicide crisis helpline, such as the [facility] at 832-169-6686. This is open 24 hours a day in the [place]  Text the Crisis Text Line at 7634573752 (in the [place].). Summary  Chronic fatigue syndrome (CFS) is a condition that causes extreme tiredness (fatigue). This fatigue does not improve with rest, and it gets worse with physical or mental activity.  There is no cure for CFS. The condition affects everyone differently. You will need to work with your team of health care providers to find the best treatments for your symptoms.  Exercise regularly, as told by your health care provider. Avoid stress and use stress-reducing techniques that you learn in therapy.  Contact a health care provider if your symptoms do not get better or they get worse. This information is not intended to replace advice given to you by your health care provider. Make sure you discuss any questions you have with your health care provider. Document Revised: 03/31/2019 Document Reviewed: 03/31/2019 Elsevier Patient Education  2021 Elsevier  Inc.

## 2020-09-29 DIAGNOSIS — Z419 Encounter for procedure for purposes other than remedying health state, unspecified: Secondary | ICD-10-CM | POA: Diagnosis not present

## 2020-10-13 ENCOUNTER — Ambulatory Visit: Payer: Medicaid Other | Admitting: Internal Medicine

## 2020-10-28 ENCOUNTER — Encounter: Payer: Self-pay | Admitting: *Deleted

## 2020-10-28 ENCOUNTER — Other Ambulatory Visit: Payer: Self-pay

## 2020-10-28 ENCOUNTER — Encounter: Payer: Self-pay | Admitting: Internal Medicine

## 2020-10-28 ENCOUNTER — Other Ambulatory Visit: Payer: Self-pay | Admitting: *Deleted

## 2020-10-28 ENCOUNTER — Telehealth: Payer: Self-pay | Admitting: *Deleted

## 2020-10-28 ENCOUNTER — Ambulatory Visit (INDEPENDENT_AMBULATORY_CARE_PROVIDER_SITE_OTHER): Payer: Medicaid Other | Admitting: Internal Medicine

## 2020-10-28 VITALS — BP 131/83 | HR 96 | Temp 97.5°F | Ht 62.0 in | Wt 172.2 lb

## 2020-10-28 DIAGNOSIS — R12 Heartburn: Secondary | ICD-10-CM

## 2020-10-28 DIAGNOSIS — K649 Unspecified hemorrhoids: Secondary | ICD-10-CM

## 2020-10-28 DIAGNOSIS — K625 Hemorrhage of anus and rectum: Secondary | ICD-10-CM | POA: Diagnosis not present

## 2020-10-28 MED ORDER — PEG 3350-KCL-NA BICARB-NACL 420 G PO SOLR: ORAL | 0 refills | Status: DC

## 2020-10-28 MED ORDER — FAMOTIDINE 20 MG PO TABS: 20.0000 mg | ORAL_TABLET | Freq: Two times a day (BID) | ORAL | 5 refills | Status: DC

## 2020-10-28 NOTE — Telephone Encounter (Signed)
PA submitted to [facility]. Reference Number: [id number]. Clinicals uploaded

## 2020-10-28 NOTE — Progress Notes (Addendum)
Primary Care Physician:  Anabel Halon, MD Primary Gastroenterologist:  Dr. Marletta Lor  Chief Complaint  Patient presents with   Hemorrhoids    C/o burning, itching, bleeding    HPI:   Susan Valdez is a 35 y.o. female who presents to the clinic today by referral from her PCP Dr. Allena Katz for evaluation.  She states since January she has had issues with what she describes as hemorrhoids.  Notes rectal bleeding with bowel movements.  Blood primarily on the toilet paper.  Notes blood is bright red.  No clots.  Also has rectal discomfort on occasion as well.  States this is intermittent, does not radiate, moderate in severity.  No previous colonoscopy.  No family history of colorectal malignancy.  Has tried Anusol cream which helps some.  Notes her bowel movements are regular, daily.  No issues with constipation or straining.  Also has heartburn which is intermittent in nature as well.  No associated dysphagia odynophagia.  No epigastric pain.  No unintentional weight loss.  Does note some associated bloating though she drinks a lot of soda.  Past Medical History:  Diagnosis Date   Chlamydia    Hypertension    Increased serum lipids    Pre-diabetes    Toxemia in pregnancy    resolved    Past Surgical History:  Procedure Laterality Date   CESAREAN SECTION     x1   FIBULA FRACTURE SURGERY Right    LAPAROSCOPIC BILATERAL SALPINGECTOMY Bilateral 04/12/2016   Procedure: LAPAROSCOPIC BILATERAL SALPINGECTOMY;  Surgeon: Lazaro Arms, MD;  Location: [facility];  Service: Gynecology;  Laterality: Bilateral;   LYSIS OF ADHESION N/A 04/12/2016   Procedure: LYSIS OF ADHESION;  Surgeon: Lazaro Arms, MD;  Location: [facility];  Service: Gynecology;  Laterality: N/A;    Current Outpatient Medications  Medication Sig Dispense Refill   hydrocortisone (ANUSOL-HC) 2.5 % rectal cream Place 1 application rectally 2 (two) times daily. (Patient taking differently: Place 1 application rectally 2 (two) times  daily. As needed) 30 g 0   lisinopril (ZESTRIL) 10 MG tablet Take 1 tablet (10 mg total) by mouth daily. 90 tablet 1   DULoxetine (CYMBALTA) 30 MG capsule Take 1 capsule (30 mg total) by mouth daily. (Patient not taking: Reported on 10/28/2020) 30 capsule 2   No current facility-administered medications for this visit.    Allergies as of 10/28/2020 - Review Complete 10/28/2020  Allergen Reaction Noted   Penicillins Hives 03/11/2012    Family History  Problem Relation Age of Onset   Hypertension Mother    Diabetes Mother    Gestational diabetes Other     Social History   Socioeconomic History   Marital status: Single    Spouse name: Not on file   Number of children: Not on file   Years of education: Not on file   Highest education level: Not on file  Occupational History   Not on file  Tobacco Use   Smoking status: Light Smoker    Pack years: 0.00   Smokeless tobacco: Never   Tobacco comments:    vapes  Vaping Use   Vaping Use: Every day  Substance and Sexual Activity   Alcohol use: Yes    Comment: occas   Drug use: No   Sexual activity: Yes    Birth control/protection: Pill  Other Topics Concern   Not on file  Social History Narrative   Not on file   Social Determinants of Health  Financial Resource Strain: Low Risk    Difficulty of Paying Living Expenses: Not hard at all  Food Insecurity: No Food Insecurity   Worried About Programme researcher, broadcasting/film/video in the Last Year: Never true   Ran Out of Food in the Last Year: Never true  Transportation Needs: No Transportation Needs   Lack of Transportation (Medical): No   Lack of Transportation (Non-Medical): No  Physical Activity: Insufficiently Active   Days of Exercise per Week: 2 days   Minutes of Exercise per Session: 30 min  Stress: No Stress Concern Present   Feeling of Stress : Not at all  Social Connections: Moderately Isolated   Frequency of Communication with Friends and Family: More than three times a week    Frequency of Social Gatherings with Friends and Family: Once a week   Attends Religious Services: More than 4 times per year   Active Member of [employer] or Organizations: No   Attends Banker Meetings: Never   Marital Status: Never married  Catering manager Violence: Not At Risk   Fear of Current or Ex-Partner: No   Emotionally Abused: No   Physically Abused: No   Sexually Abused: No    Subjective: Review of Systems  Constitutional:  Negative for chills and fever.  HENT:  Negative for congestion and hearing loss.   Eyes:  Negative for blurred vision and double vision.  Respiratory:  Negative for cough and shortness of breath.   Cardiovascular:  Negative for chest pain and palpitations.  Gastrointestinal:  Positive for blood in stool. Negative for abdominal pain, constipation, diarrhea, heartburn, melena and vomiting.       Rectal bleeding  Genitourinary:  Negative for dysuria and urgency.  Musculoskeletal:  Negative for joint pain and myalgias.  Skin:  Negative for itching and rash.  Neurological:  Negative for dizziness and headaches.  Psychiatric/Behavioral:  Negative for depression. The patient is not nervous/anxious.       Objective: BP 131/83   Pulse 96   Temp (!) 97.5 F (36.4 C)   Ht 5\' 2"  (1.575 m)   Wt 172 lb 3.2 oz (78.1 kg)   LMP 09/27/2020 (Approximate)   BMI 31.50 kg/m  Physical Exam Constitutional:      Appearance: Normal appearance.  HENT:     Head: Normocephalic and atraumatic.  Eyes:     Extraocular Movements: Extraocular movements intact.     Conjunctiva/sclera: Conjunctivae normal.  Cardiovascular:     Rate and Rhythm: Normal rate and regular rhythm.     Heart sounds: Murmur heard.  Pulmonary:     Effort: Pulmonary effort is normal.     Breath sounds: Normal breath sounds.  Abdominal:     General: Bowel sounds are normal.     Palpations: Abdomen is soft.  Musculoskeletal:        General: No swelling. Normal range of motion.      Cervical back: Normal range of motion and neck supple.  Skin:    General: Skin is warm and dry.     Coloration: Skin is not jaundiced.  Neurological:     General: No focal deficit present.     Mental Status: She is alert and oriented to person, place, and time.  Psychiatric:        Mood and Affect: Mood normal.        Behavior: Behavior normal.  Rectal exam deferred until time of colonoscopy   Assessment: *Rectal bleeding *Rectal discomfort *Heartburn-intermittent  Plan: Discussed patient's symptoms  in depth with her today.  Likely due to hemorrhoids though cannot rule out other causes including fissures, proctitis, polyps, or other.  I discussed conservative management with Anusol cream, sitz bath's, fiber therapy versus further investigation with colonoscopy.  Patient would like to proceed with colonoscopy given her worsening rectal pain as well as bleeding.  Will schedule for diagnostic colonoscopy.The risks including infection, bleed, or perforation as well as benefits, limitations, alternatives and imponderables have been reviewed with the patient. Questions have been answered. All parties agreeable.  Recommended rectal exam today and patient states she would prefer to have this done at time of colonoscopy.  For her intermittent heartburn, I will send in famotidine 20 mg twice daily.  No alarm symptoms to warrant upper endoscopy at this time.  Enck you Dr. Allena Katz for the kind referral.   10/28/2020 9:57 AM   Disclaimer: This note was dictated with voice recognition software. Similar sounding words can inadvertently be transcribed and may not be corrected upon review.

## 2020-10-28 NOTE — Patient Instructions (Signed)
We will schedule you for colonoscopy to further evaluate your rectal bleeding and discomfort.  We may consider hemorrhoid banding pending endoscopic evaluation.  For your heartburn I am going to send you in a new medication called famotidine 20 mg twice daily.  You can start out taking this once daily and see how you do but certainly safe to take twice daily if needed.  Further recommendations to follow.  At [facility] we value your feedback. You may receive a survey about your visit today. Please share your experience as we strive to create trusting relationships with our patients to provide genuine, compassionate, quality care.  We appreciate your understanding and patience as we review any laboratory studies, imaging, and other diagnostic tests that are ordered as we care for you. Our office policy is 5 business days for review of these results, and any emergent or urgent results are addressed in a timely manner for your best interest. If you do not hear from our office in 1 week, please contact us.   We also encourage the use of MyChart, which contains your medical information for your review as well. If you are not enrolled in this feature, an access code is on this after visit summary for your convenience. Thank you for allowing [place] to be involved in your care.  It was great to see you today!  I hope you have a great rest of your summer!!    Hennie Duos. Marletta Lor, D.O. Gastroenterology and Hepatology [facility]

## 2020-10-29 DIAGNOSIS — Z419 Encounter for procedure for purposes other than remedying health state, unspecified: Secondary | ICD-10-CM | POA: Diagnosis not present

## 2020-11-05 ENCOUNTER — Other Ambulatory Visit: Payer: Self-pay

## 2020-11-05 NOTE — Telephone Encounter (Signed)
Fax received from [facility], [employer] approved. PA# [id number], valid 11/16/20-01/15/21.  FYI to Mindy.

## 2020-11-10 ENCOUNTER — Telehealth: Payer: Self-pay | Admitting: Internal Medicine

## 2020-11-10 NOTE — Telephone Encounter (Signed)
Pt wants to cancel her procedure on 7/19 with Dr Marletta Lor and will call back later to reschedule

## 2020-11-10 NOTE — Telephone Encounter (Signed)
Noted. Endo scheduler informed to cancel procedure.  FYI to Dr. Marletta Lor.

## 2020-11-16 ENCOUNTER — Encounter (HOSPITAL_COMMUNITY): Admission: RE | Payer: Self-pay | Source: Home / Self Care

## 2020-11-16 ENCOUNTER — Ambulatory Visit (HOSPITAL_COMMUNITY): Admission: RE | Admit: 2020-11-16 | Payer: Medicaid Other | Source: Home / Self Care

## 2020-11-16 SURGERY — COLONOSCOPY WITH PROPOFOL
Anesthesia: Monitor Anesthesia Care

## 2020-11-29 DIAGNOSIS — Z419 Encounter for procedure for purposes other than remedying health state, unspecified: Secondary | ICD-10-CM | POA: Diagnosis not present

## 2020-12-30 DIAGNOSIS — Z419 Encounter for procedure for purposes other than remedying health state, unspecified: Secondary | ICD-10-CM | POA: Diagnosis not present

## 2021-01-18 ENCOUNTER — Encounter: Payer: Self-pay | Admitting: Internal Medicine

## 2021-01-18 ENCOUNTER — Ambulatory Visit (INDEPENDENT_AMBULATORY_CARE_PROVIDER_SITE_OTHER): Payer: Medicaid Other | Admitting: Internal Medicine

## 2021-01-18 ENCOUNTER — Other Ambulatory Visit: Payer: Self-pay

## 2021-01-18 DIAGNOSIS — J069 Acute upper respiratory infection, unspecified: Secondary | ICD-10-CM

## 2021-01-18 MED ORDER — NOREL AD 4-10-325 MG PO TABS: 1.0000 | ORAL_TABLET | Freq: Four times a day (QID) | ORAL | 0 refills | Status: DC | PRN

## 2021-01-18 NOTE — Progress Notes (Signed)
Virtual Visit via Telephone Note   This visit type was conducted due to national recommendations for restrictions regarding the COVID-19 Pandemic (e.g. social distancing) in an effort to limit this patient's exposure and mitigate transmission in our community.  Due to her co-morbid illnesses, this patient is at least at moderate risk for complications without adequate follow up.  This format is felt to be most appropriate for this patient at this time.  The patient did not have access to video technology/had technical difficulties with video requiring transitioning to audio format only (telephone).  All issues noted in this document were discussed and addressed.  No physical exam could be performed with this format.  Evaluation Performed:  Follow-up visit  Date:  01/18/2021   ID:  Susan Valdez, DOB 07/20/1985, MRN [id number]  Patient Location: Home Provider Location: Office/Clinic  Participants: Patient Location of Patient: Home Location of Provider: Telehealth Consent was obtain for visit to be over via telehealth. I verified that I am speaking with the correct person using two identifiers.  PCP:  Anabel Halon, MD   Chief Complaint:  Nasal congestion and cough  History of Present Illness:    Susan Valdez is a 35 y.o. female who has a televisit for c/o nasal congestion, cough and sore throat for last 3 days. She denies any fever, chills, dyspnea or wheezing. She has 2 negative COVID tests at home.  The patient does have symptoms concerning for COVID-19 infection (fever, chills, cough, or new shortness of breath).   Past Medical, Surgical, Social History, Allergies, and Medications have been Reviewed.  Past Medical History:  Diagnosis Date   Chlamydia    Hypertension    Increased serum lipids    Pre-diabetes    Toxemia in pregnancy    resolved   Past Surgical History:  Procedure Laterality Date   CESAREAN SECTION     x1   FIBULA FRACTURE SURGERY Right     LAPAROSCOPIC BILATERAL SALPINGECTOMY Bilateral 04/12/2016   Procedure: LAPAROSCOPIC BILATERAL SALPINGECTOMY;  Surgeon: Lazaro Arms, MD;  Location: [facility];  Service: Gynecology;  Laterality: Bilateral;   LYSIS OF ADHESION N/A 04/12/2016   Procedure: LYSIS OF ADHESION;  Surgeon: Lazaro Arms, MD;  Location: [facility];  Service: Gynecology;  Laterality: N/A;     Current Meds  Medication Sig   Chlorphen-PE-Acetaminophen (NOREL AD) 4-10-325 MG TABS Take 1 tablet by mouth every 6 (six) hours as needed.   DULoxetine (CYMBALTA) 30 MG capsule Take 1 capsule (30 mg total) by mouth daily.   famotidine (PEPCID) 20 MG tablet Take 1 tablet (20 mg total) by mouth 2 (two) times daily.   hydrocortisone (ANUSOL-HC) 2.5 % rectal cream Place 1 application rectally 2 (two) times daily. (Patient taking differently: Place 1 application rectally 2 (two) times daily. As needed)   lisinopril (ZESTRIL) 10 MG tablet Take 1 tablet (10 mg total) by mouth daily.   polyethylene glycol-electrolytes (NULYTELY) 420 g solution As directed     Allergies:   Penicillins   ROS:   Please see the history of present illness.     All other systems reviewed and are negative.   Labs/Other Tests and Data Reviewed:    Recent Labs: 01/26/2020: ALT 98 08/26/2020: BUN 8; Creatinine, Ser 0.63; Hemoglobin 13.6; Platelets 324; Potassium 4.5; Sodium 140; TSH 1.010   Recent Lipid Panel Lab Results  Component Value Date/Time   CHOL 235 (H) 01/26/2020 01:59 PM   TRIG 150 (H) 01/26/2020 01:59  PM   HDL 35 (L) 01/26/2020 01:59 PM   CHOLHDL 6.7 (H) 01/26/2020 01:59 PM   LDLCALC 172 (H) 01/26/2020 01:59 PM    Wt Readings from Last 3 Encounters:  10/28/20 172 lb 3.2 oz (78.1 kg)  09/22/20 171 lb 1.9 oz (77.6 kg)  08/26/20 170 lb (77.1 kg)     ASSESSMENT & PLAN:    URTI Norel PRN Advised to avoid Sudafed for more than 3 days Likely URTI from viral etiology, reassured that it is self-resolving If persistent symptoms, will start  antibiotics Work note provided  Time:   Today, I have spent 9 minutes reviewing the chart, including problem list, medications, and with the patient with telehealth technology discussing the above problems.   Medication Adjustments/Labs and Tests Ordered: Current medicines are reviewed at length with the patient today.  Concerns regarding medicines are outlined above.   Tests Ordered: No orders of the defined types were placed in this encounter.   Medication Changes: Meds ordered this encounter  Medications   Chlorphen-PE-Acetaminophen (NOREL AD) 4-10-325 MG TABS    Sig: Take 1 tablet by mouth every 6 (six) hours as needed.    Dispense:  30 tablet    Refill:  0     Note: This dictation was prepared with Dragon dictation along with smaller phrase technology. Similar sounding words can be transcribed inadequately or may not be corrected upon review. Any transcriptional errors that result from this process are unintentional.      Disposition:  Follow up  Signed, Anabel Halon, MD  01/18/2021 3:12 PM     [facility]

## 2021-01-19 ENCOUNTER — Other Ambulatory Visit: Payer: Self-pay

## 2021-01-19 ENCOUNTER — Telehealth: Payer: Self-pay

## 2021-01-19 DIAGNOSIS — J069 Acute upper respiratory infection, unspecified: Secondary | ICD-10-CM

## 2021-01-19 MED ORDER — NOREL AD 4-10-325 MG PO TABS
1.0000 | ORAL_TABLET | Freq: Four times a day (QID) | ORAL | 0 refills | Status: DC | PRN
Start: 2021-01-19 — End: 2021-01-24

## 2021-01-19 NOTE — Telephone Encounter (Signed)
Resent to [employer]

## 2021-01-19 NOTE — Telephone Encounter (Signed)
Patient called said [facility] has not received this medicine yet.  Chlorphen-PE-Acetaminophen (NOREL AD) 4-10-325 MG TABS

## 2021-01-20 IMAGING — DX DG LUMBAR SPINE COMPLETE 4+V
5 series · 5 of 5 positions shown · non-contrast
Comparison: None.

CLINICAL DATA: Low back pain which radiates across both hips. Pain
started while at work.

EXAM:
LUMBAR SPINE - COMPLETE 4+ VIEW

[l-spine ap]
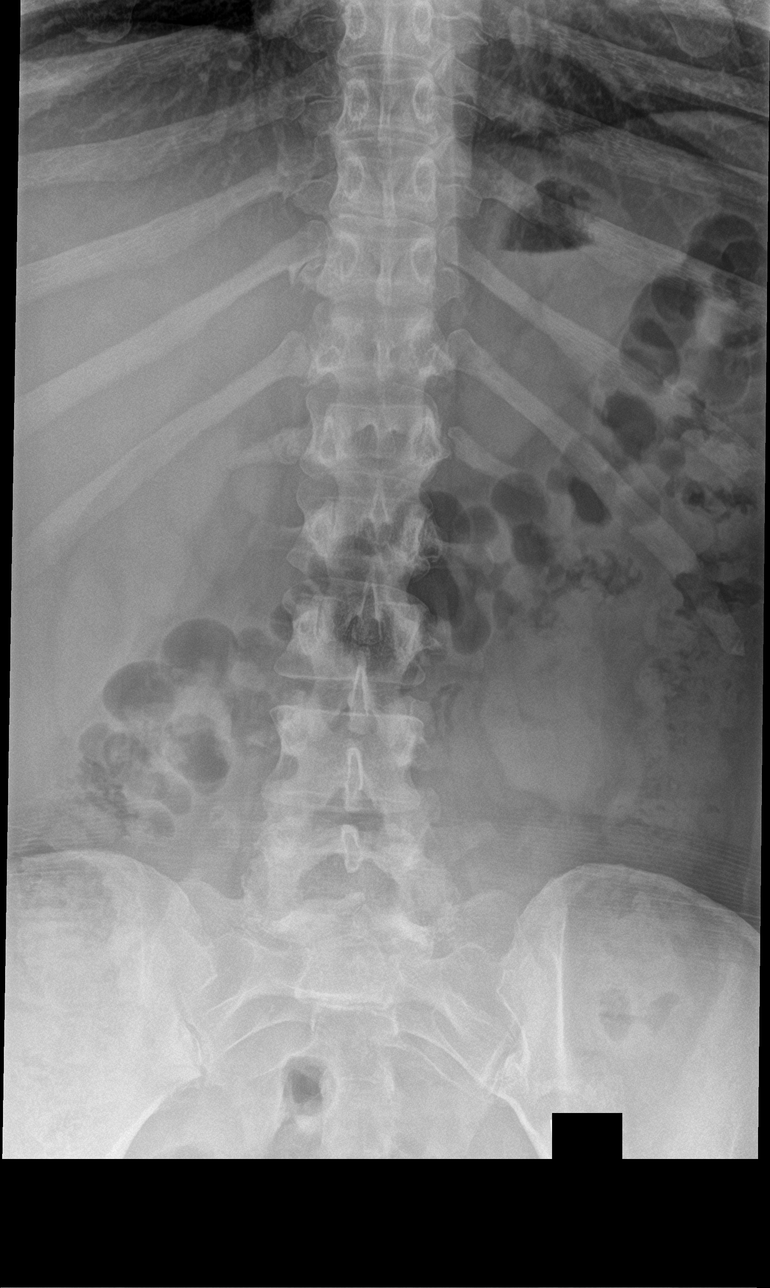

[l-spine obl (1 of 2)]
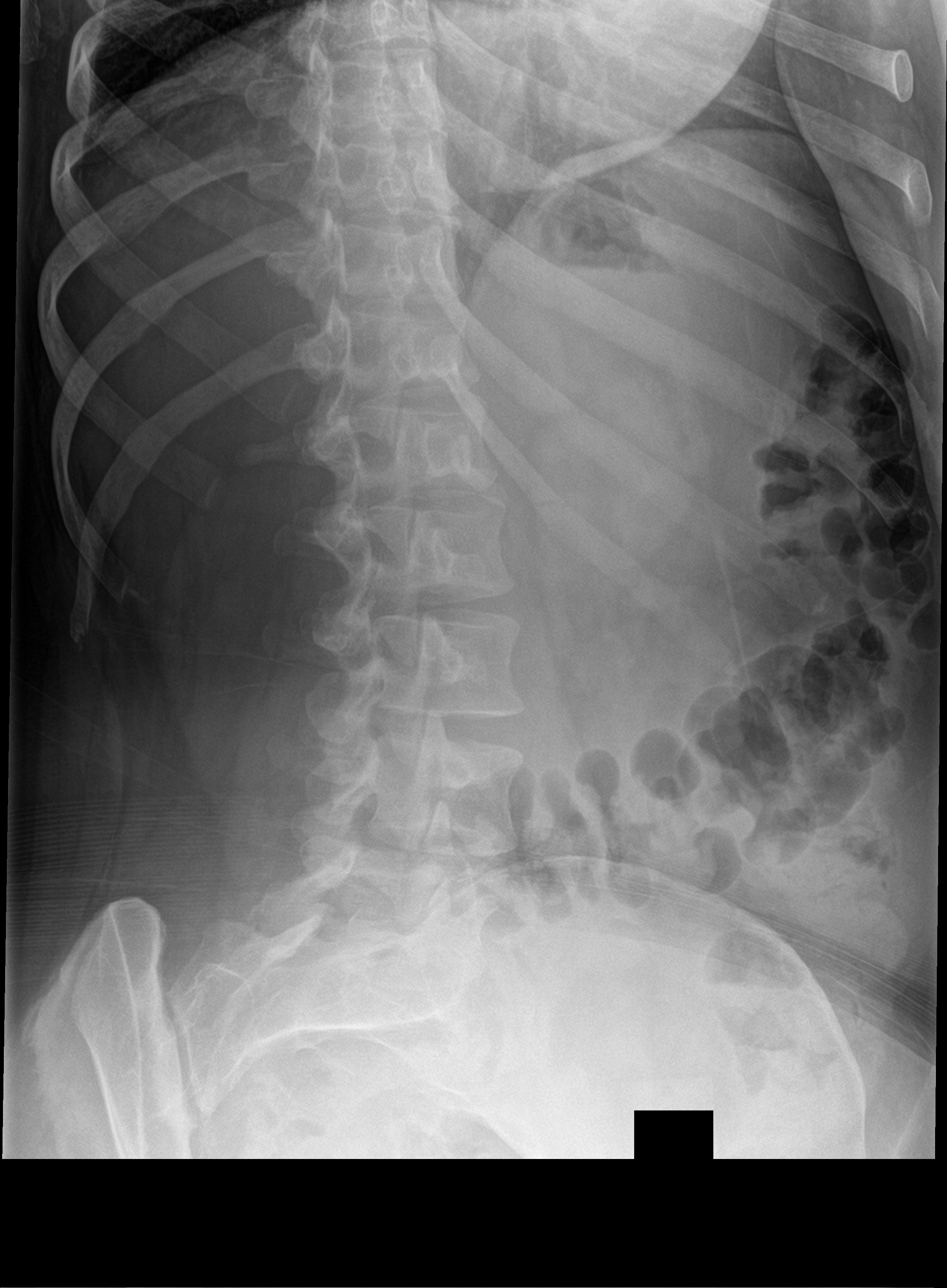

[l-spine lat]
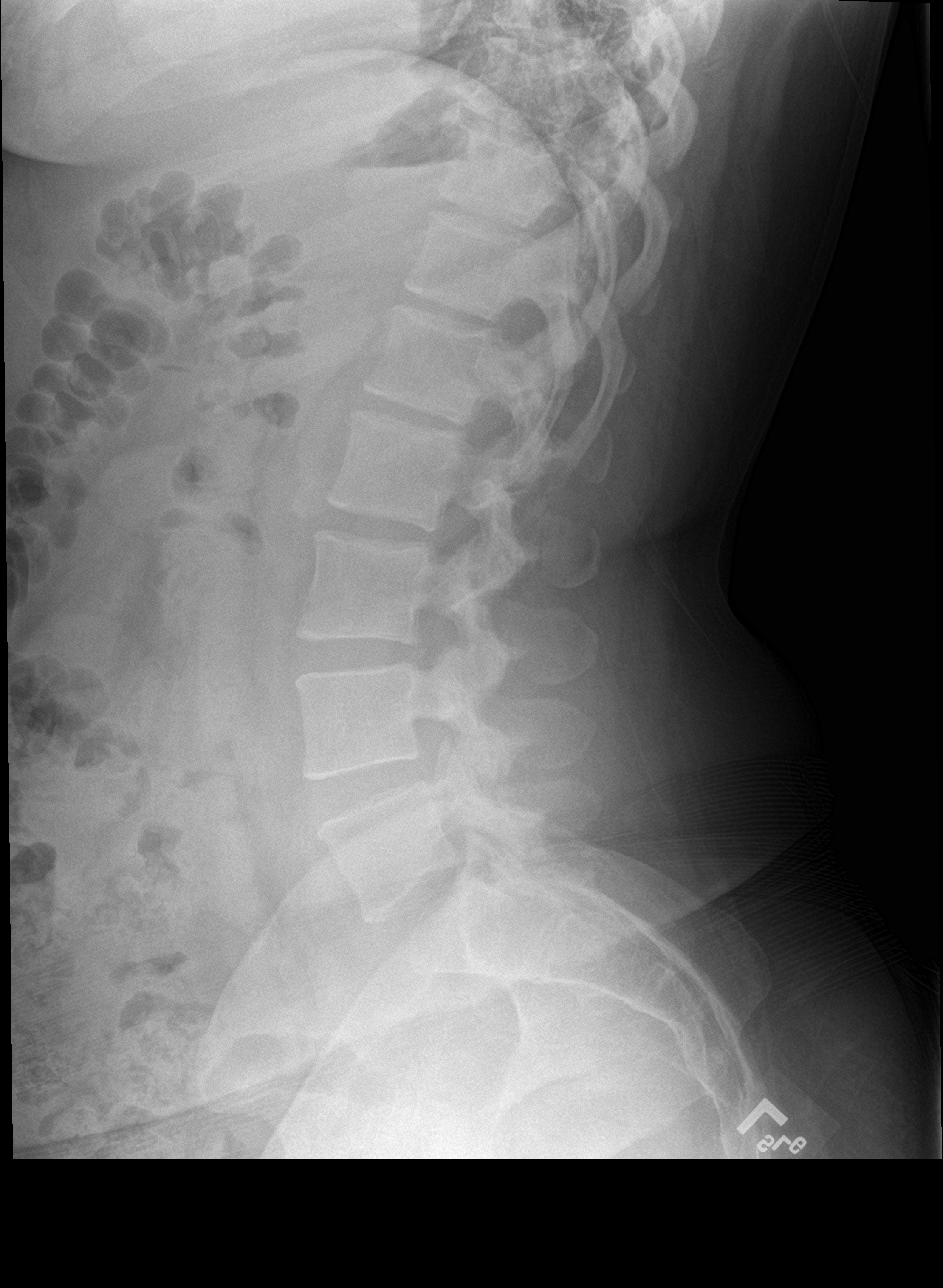

[l-spine spot]
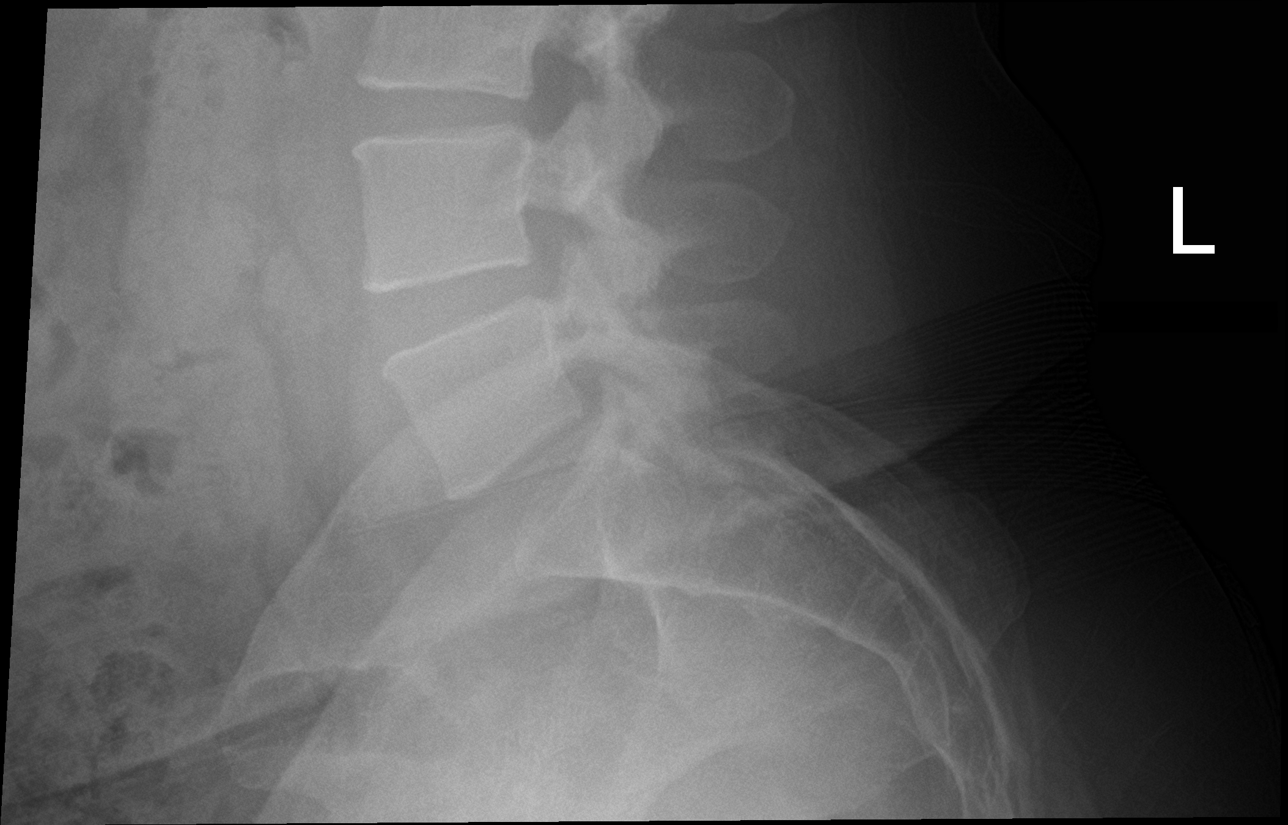

[l-spine obl (2 of 2)]
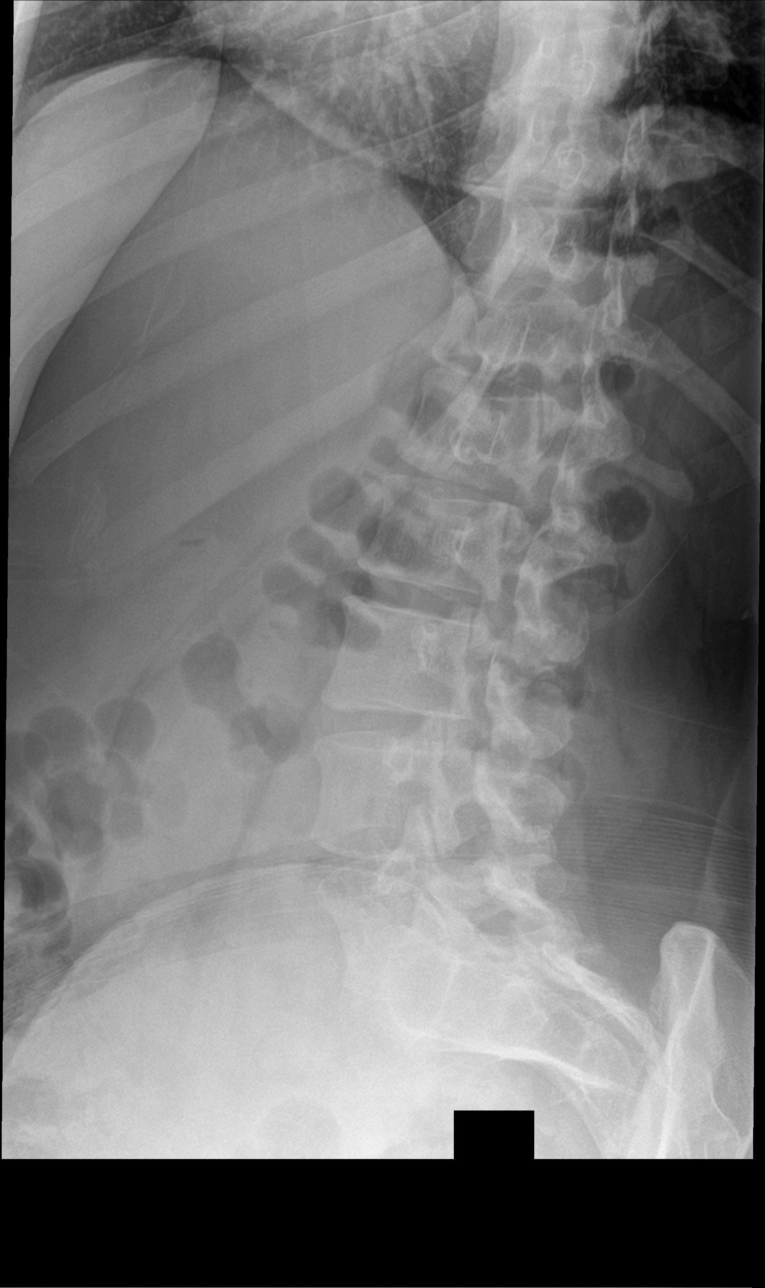

[5 of 5 positions shown; findings below may reference images not displayed]

FINDINGS: There is no evidence of lumbar spine fracture. Alignment is normal.
Intervertebral disc spaces are maintained.
IMPRESSION: Negative.

## 2021-01-24 ENCOUNTER — Ambulatory Visit (INDEPENDENT_AMBULATORY_CARE_PROVIDER_SITE_OTHER): Payer: Medicaid Other | Admitting: Internal Medicine

## 2021-01-24 ENCOUNTER — Other Ambulatory Visit: Payer: Self-pay

## 2021-01-24 ENCOUNTER — Encounter: Payer: Self-pay | Admitting: Internal Medicine

## 2021-01-24 VITALS — BP 134/88 | HR 97 | Resp 18 | Ht 63.0 in | Wt 173.1 lb

## 2021-01-24 DIAGNOSIS — K649 Unspecified hemorrhoids: Secondary | ICD-10-CM

## 2021-01-24 DIAGNOSIS — R5382 Chronic fatigue, unspecified: Secondary | ICD-10-CM | POA: Diagnosis not present

## 2021-01-24 DIAGNOSIS — I10 Essential (primary) hypertension: Secondary | ICD-10-CM

## 2021-01-24 DIAGNOSIS — F419 Anxiety disorder, unspecified: Secondary | ICD-10-CM

## 2021-01-24 MED ORDER — DULOXETINE HCL 30 MG PO CPEP: 30.0000 mg | ORAL_CAPSULE | Freq: Every day | ORAL | 2 refills | Status: DC

## 2021-01-24 NOTE — Assessment & Plan Note (Signed)
Chronic fatigue, could be multifactorial Had COVID infection few months ago Had been changing jobs recently, which has been stressful for her Also reports mild anhedonia and anger bursts at times Did not start Cymbalta, advised to start it Has not had blood work done, advised to do it in AM

## 2021-01-24 NOTE — Assessment & Plan Note (Addendum)
Some of her symptoms are likely related to anxiety Started Cymbalta considering her chronic fatigue

## 2021-01-24 NOTE — Progress Notes (Signed)
Established Patient Office Visit  Subjective:  Patient ID: Susan Valdez, female    DOB: [birth year]  Age: 35 y.o. MRN: [id number]  CC:  Chief Complaint  Patient presents with   Hypertension   Fatigue    HPI Susan Valdez is a 35 year old female with PMH of HTN and HLD who presents for follow up of HTN and chronic fatigue.  HTN: BP is well-controlled. Takes medications regularly. Patient denies headache, dizziness, chest pain, dyspnea or palpitations.  She continues to have worse fatigue. Of note, she did not start taking Cymbalta. She has been stressed at her workplace as well. She has had anhedonia and anger bursts at times. Denies any SI or HI. She has severe heat insensitivity and b/l [place] pain yesterday, for which she could not go to work. Denies any fever, chills, nausea or vomiting.  Past Medical History:  Diagnosis Date   Chlamydia    Hypertension    Increased serum lipids    Pre-diabetes    Toxemia in pregnancy    resolved    Past Surgical History:  Procedure Laterality Date   CESAREAN SECTION     x1   FIBULA FRACTURE SURGERY Right    LAPAROSCOPIC BILATERAL SALPINGECTOMY Bilateral 04/12/2016   Procedure: LAPAROSCOPIC BILATERAL SALPINGECTOMY;  Surgeon: Florian Buff, MD;  Location: [facility];  Service: Gynecology;  Laterality: Bilateral;   LYSIS OF ADHESION N/A 04/12/2016   Procedure: LYSIS OF ADHESION;  Surgeon: Florian Buff, MD;  Location: [facility];  Service: Gynecology;  Laterality: N/A;    Family History  Problem Relation Age of Onset   Hypertension Mother    Diabetes Mother    Gestational diabetes Other     Social History   Socioeconomic History   Marital status: Single    Spouse name: Not on file   Number of children: Not on file   Years of education: Not on file   Highest education level: Not on file  Occupational History   Not on file  Tobacco Use   Smoking status: Former    Types: Cigarettes   Smokeless tobacco: Never  Vaping Use   Vaping  Use: Every day  Substance and Sexual Activity   Alcohol use: Yes    Comment: occas   Drug use: No   Sexual activity: Yes    Birth control/protection: Pill  Other Topics Concern   Not on file  Social History Narrative   Not on file   Social Determinants of Health   Financial Resource Strain: Low Risk    Difficulty of Paying Living Expenses: Not hard at all  Food Insecurity: No Food Insecurity   Worried About Charity fundraiser in the Last Year: Never true   [place] in the Last Year: Never true  Transportation Needs: No Transportation Needs   Lack of Transportation (Medical): No   Lack of Transportation (Non-Medical): No  Physical Activity: Insufficiently Active   Days of Exercise per Week: 2 days   Minutes of Exercise per Session: 30 min  Stress: No Stress Concern Present   Feeling of Stress : Not at all  Social Connections: Moderately Isolated   Frequency of Communication with Friends and Family: More than three times a week   Frequency of Social Gatherings with Friends and Family: Once a week   Attends Religious Services: More than 4 times per year   Active Member of [employer] or Organizations: No   Attends Archivist Meetings: Never  Marital Status: Never married  Human resources officer Violence: Not At Risk   Fear of Current or Ex-Partner: No   Emotionally Abused: No   Physically Abused: No   Sexually Abused: No    Outpatient Medications Prior to Visit  Medication Sig Dispense Refill   famotidine (PEPCID) 20 MG tablet Take 1 tablet (20 mg total) by mouth 2 (two) times daily. 60 tablet 5   hydrocortisone (ANUSOL-HC) 2.5 % rectal cream Place 1 application rectally 2 (two) times daily. (Patient taking differently: Place 1 application rectally 2 (two) times daily. As needed) 30 g 0   lisinopril (ZESTRIL) 10 MG tablet Take 1 tablet (10 mg total) by mouth daily. 90 tablet 1   polyethylene glycol-electrolytes (NULYTELY) 420 g solution As directed 4000 mL 0    Chlorphen-PE-Acetaminophen (NOREL AD) 4-10-325 MG TABS Take 1 tablet by mouth every 6 (six) hours as needed. 30 tablet 0   DULoxetine (CYMBALTA) 30 MG capsule Take 1 capsule (30 mg total) by mouth daily. 30 capsule 2   No facility-administered medications prior to visit.    Allergies  Allergen Reactions   Penicillins Hives    Has patient had a PCN reaction causing immediate rash, facial/tongue/throat swelling, SOB or lightheadedness with hypotension:unsure Has patient had a PCN reaction causing severe rash involving mucus membranes or skin necrosis:unsure Has patient had a PCN reaction that required hospitalization:No Has patient had a PCN reaction occurring within the last 10 years:No If all of the above answers are "NO", then may proceed with Cephalosporin use. Childhood reaction    ROS Review of Systems  Constitutional:  Positive for fatigue. Negative for chills and fever.  HENT:  Negative for congestion, sinus pressure, sinus pain and sore throat.   Eyes:  Negative for pain and discharge.  Respiratory:  Negative for cough and shortness of breath.   Cardiovascular:  Negative for chest pain and palpitations.  Gastrointestinal:  Negative for abdominal pain, constipation, diarrhea, nausea and vomiting.  Endocrine: Negative for polydipsia and polyuria.  Genitourinary:  Negative for dysuria and hematuria.  Musculoskeletal:  Negative for neck pain and neck stiffness.  Skin:  Negative for rash.  Neurological:  Negative for dizziness, syncope, weakness and headaches.  Psychiatric/Behavioral:  Positive for dysphoric mood. Negative for agitation and behavioral problems. The patient is nervous/anxious.      Objective:    Physical Exam Vitals reviewed.  Constitutional:      General: She is not in acute distress.    Appearance: She is not diaphoretic.  HENT:     Head: Normocephalic and atraumatic.     Nose: Nose normal.     Mouth/Throat:     Mouth: Mucous membranes are moist.   Eyes:     General: No scleral icterus.    Extraocular Movements: Extraocular movements intact.  Cardiovascular:     Rate and Rhythm: Normal rate and regular rhythm.     Pulses: Normal pulses.     Heart sounds: Normal heart sounds. No murmur heard. Pulmonary:     Breath sounds: Normal breath sounds. No wheezing or rales.  Musculoskeletal:     Cervical back: Neck supple. No tenderness.     Right lower leg: No edema.     Left lower leg: No edema.  Skin:    General: Skin is warm.     Findings: No rash.  Neurological:     General: No focal deficit present.     Mental Status: She is alert and oriented to person, place, and time.  Sensory: No sensory deficit.     Motor: No weakness.  Psychiatric:        Mood and Affect: Mood normal.        Behavior: Behavior normal.    BP 134/88 (BP Location: Right Arm, Cuff Size: Normal)   Pulse 97   Resp 18   Ht 5' 3"  (1.6 m)   Wt 173 lb 1.9 oz (78.5 kg)   SpO2 96%   BMI 30.67 kg/m  Wt Readings from Last 3 Encounters:  01/24/21 173 lb 1.9 oz (78.5 kg)  10/28/20 172 lb 3.2 oz (78.1 kg)  09/22/20 171 lb 1.9 oz (77.6 kg)     Health Maintenance Due  Topic Date Due   COVID-19 Vaccine (1) Never done   Hepatitis C Screening  Never done   TETANUS/TDAP  Never done   INFLUENZA VACCINE  Never done    There are no preventive care reminders to display for this patient.  Lab Results  Component Value Date   TSH 1.010 08/26/2020   Lab Results  Component Value Date   WBC 10.0 08/26/2020   HGB 13.6 08/26/2020   HCT 39.7 08/26/2020   MCV 93 08/26/2020   PLT 324 08/26/2020   Lab Results  Component Value Date   NA 140 08/26/2020   K 4.5 08/26/2020   CO2 22 08/26/2020   GLUCOSE 94 08/26/2020   BUN 8 08/26/2020   CREATININE 0.63 08/26/2020   BILITOT 0.3 01/26/2020   ALKPHOS 69 01/26/2020   AST 84 (H) 01/26/2020   ALT 98 (H) 01/26/2020   PROT 7.4 01/26/2020   ALBUMIN 4.6 01/26/2020   CALCIUM 10.0 08/26/2020   ANIONGAP 9  04/05/2016   EGFR 120 08/26/2020   Lab Results  Component Value Date   CHOL 235 (H) 01/26/2020   Lab Results  Component Value Date   HDL 35 (L) 01/26/2020   Lab Results  Component Value Date   LDLCALC 172 (H) 01/26/2020   Lab Results  Component Value Date   TRIG 150 (H) 01/26/2020   Lab Results  Component Value Date   CHOLHDL 6.7 (H) 01/26/2020   No results found for: HGBA1C    Assessment & Plan:   Problem List Items Addressed This Visit       Cardiovascular and Mediastinum   Hypertension - Primary    BP Readings from Last 1 Encounters:  01/24/21 134/88  Well-controlled with Lisinopril Counseled for compliance with the medications Advised DASH diet and moderate exercise/walking, at least 150 mins/week      Hemorrhoid    Chronic rectal bleeding Needs to get colonoscopy with GI Anusol PRN        Other   Fatigue    Chronic fatigue, could be multifactorial Had COVID infection few months ago Had been changing jobs recently, which has been stressful for her Also reports mild anhedonia and anger bursts at times Did not start Cymbalta, advised to start it Has not had blood work done, advised to do it in AM      Relevant Medications   DULoxetine (CYMBALTA) 30 MG capsule   Anxiety    Some of her symptoms are likely related to anxiety Started Cymbalta considering her chronic fatigue      Relevant Medications   DULoxetine (CYMBALTA) 30 MG capsule    Meds ordered this encounter  Medications   DULoxetine (CYMBALTA) 30 MG capsule    Sig: Take 1 capsule (30 mg total) by mouth daily.    Dispense:  30 capsule    Refill:  2    Follow-up: Return in about 3 months (around 04/25/2021) for Annual physical.    Lindell Spar, MD

## 2021-01-24 NOTE — Assessment & Plan Note (Signed)
Chronic rectal bleeding Needs to get colonoscopy with GI Anusol PRN

## 2021-01-24 NOTE — Patient Instructions (Addendum)
Please start taking Cymbalta as prescribed.  Continue to take other medications as prescribed.  Please get fasting blood tests done in the morning.

## 2021-01-24 NOTE — Assessment & Plan Note (Addendum)
BP Readings from Last 1 Encounters:  01/24/21 134/88   Well-controlled with Lisinopril Counseled for compliance with the medications Advised DASH diet and moderate exercise/walking, at least 150 mins/week

## 2021-01-27 DIAGNOSIS — I10 Essential (primary) hypertension: Secondary | ICD-10-CM | POA: Diagnosis not present

## 2021-01-27 DIAGNOSIS — Z114 Encounter for screening for human immunodeficiency virus [HIV]: Secondary | ICD-10-CM | POA: Diagnosis not present

## 2021-01-27 DIAGNOSIS — Z1159 Encounter for screening for other viral diseases: Secondary | ICD-10-CM | POA: Diagnosis not present

## 2021-01-27 DIAGNOSIS — R5382 Chronic fatigue, unspecified: Secondary | ICD-10-CM | POA: Diagnosis not present

## 2021-01-27 DIAGNOSIS — E785 Hyperlipidemia, unspecified: Secondary | ICD-10-CM | POA: Diagnosis not present

## 2021-01-28 LAB — CMP14+EGFR
ALT: 19 IU/L (ref 0–32)
AST: 21 IU/L (ref 0–40)
Albumin/Globulin Ratio: 2.2 (ref 1.2–2.2)
Albumin: 4.7 g/dL (ref 3.8–4.8)
Alkaline Phosphatase: 59 IU/L (ref 44–121)
BUN/Creatinine Ratio: 12 (ref 9–23)
BUN: 8 mg/dL (ref 6–20)
Bilirubin Total: 0.5 mg/dL (ref 0.0–1.2)
CO2: 20 mmol/L (ref 20–29)
Calcium: 9.5 mg/dL (ref 8.7–10.2)
Chloride: 103 mmol/L (ref 96–106)
Creatinine, Ser: 0.69 mg/dL (ref 0.57–1.00)
Globulin, Total: 2.1 g/dL (ref 1.5–4.5)
Glucose: 134 mg/dL — ABNORMAL HIGH (ref 70–99)
Potassium: 4.4 mmol/L (ref 3.5–5.2)
Sodium: 139 mmol/L (ref 134–144)
Total Protein: 6.8 g/dL (ref 6.0–8.5)
eGFR: 117 mL/min/{1.73_m2} (ref >59)

## 2021-01-28 LAB — CBC WITH DIFFERENTIAL/PLATELET
Basophils Absolute: 0 10*3/uL (ref 0.0–0.2)
Basos: 1 %
EOS (ABSOLUTE): 0.1 10*3/uL (ref 0.0–0.4)
Eos: 1 %
Hematocrit: 37.4 % (ref 34.0–46.6)
Hemoglobin: 12.8 g/dL (ref 11.1–15.9)
Immature Grans (Abs): 0 10*3/uL (ref 0.0–0.1)
Immature Granulocytes: 1 %
Lymphocytes Absolute: 2.5 10*3/uL (ref 0.7–3.1)
Lymphs: 39 %
MCH: 31.2 pg (ref 26.6–33.0)
MCHC: 34.2 g/dL (ref 31.5–35.7)
MCV: 91 fL (ref 79–97)
Monocytes Absolute: 0.4 10*3/uL (ref 0.1–0.9)
Monocytes: 7 %
Neutrophils Absolute: 3.4 10*3/uL (ref 1.4–7.0)
Neutrophils: 51 %
Platelets: 327 10*3/uL (ref 150–450)
RBC: 4.1 x10E6/uL (ref 3.77–5.28)
RDW: 11.8 % (ref 11.7–15.4)
WBC: 6.5 10*3/uL (ref 3.4–10.8)

## 2021-01-28 LAB — TSH+FREE T4
Free T4: 1.11 ng/dL (ref 0.82–1.77)
TSH: 0.902 u[IU]/mL (ref 0.450–4.500)

## 2021-01-28 LAB — CORTISOL: Cortisol: 10.6 ug/dL

## 2021-01-28 LAB — HEPATITIS C ANTIBODY: Hep C Virus Ab: <0.1 s/co ratio (ref 0.0–0.9)

## 2021-01-28 LAB — LIPID PANEL
Chol/HDL Ratio: 7.3 ratio — ABNORMAL HIGH (ref 0.0–4.4)
Cholesterol, Total: 225 mg/dL — ABNORMAL HIGH (ref 100–199)
HDL: 31 mg/dL — ABNORMAL LOW (ref >39)
LDL Chol Calc (NIH): 154 mg/dL — ABNORMAL HIGH (ref 0–99)
Triglycerides: 215 mg/dL — ABNORMAL HIGH (ref 0–149)
VLDL Cholesterol Cal: 40 mg/dL (ref 5–40)

## 2021-01-28 LAB — HEMOGLOBIN A1C
Est. average glucose Bld gHb Est-mCnc: 137 mg/dL
Hgb A1c MFr Bld: 6.4 % — ABNORMAL HIGH (ref 4.8–5.6)

## 2021-01-28 LAB — HIV ANTIBODY (ROUTINE TESTING W REFLEX): HIV Screen 4th Generation wRfx: NONREACTIVE

## 2021-01-28 LAB — VITAMIN D 25 HYDROXY (VIT D DEFICIENCY, FRACTURES): Vit D, 25-Hydroxy: 27 ng/mL — ABNORMAL LOW (ref 30.0–100.0)

## 2021-01-29 DIAGNOSIS — Z419 Encounter for procedure for purposes other than remedying health state, unspecified: Secondary | ICD-10-CM | POA: Diagnosis not present

## 2021-03-01 DIAGNOSIS — Z419 Encounter for procedure for purposes other than remedying health state, unspecified: Secondary | ICD-10-CM | POA: Diagnosis not present

## 2021-03-16 ENCOUNTER — Telehealth: Payer: Self-pay | Admitting: Internal Medicine

## 2021-03-16 ENCOUNTER — Other Ambulatory Visit: Payer: Self-pay | Admitting: *Deleted

## 2021-03-16 ENCOUNTER — Other Ambulatory Visit: Payer: Self-pay | Admitting: Internal Medicine

## 2021-03-16 DIAGNOSIS — I10 Essential (primary) hypertension: Secondary | ICD-10-CM

## 2021-03-16 NOTE — Telephone Encounter (Signed)
Pt medication was sent to pharmacy 03-16-21

## 2021-03-16 NOTE — Telephone Encounter (Signed)
Pt called in for refills on   LISINOPRIL  [facility]

## 2021-03-31 DIAGNOSIS — Z419 Encounter for procedure for purposes other than remedying health state, unspecified: Secondary | ICD-10-CM | POA: Diagnosis not present

## 2021-04-18 ENCOUNTER — Ambulatory Visit (INDEPENDENT_AMBULATORY_CARE_PROVIDER_SITE_OTHER): Payer: Medicaid Other | Admitting: Internal Medicine

## 2021-04-18 ENCOUNTER — Encounter: Payer: Self-pay | Admitting: Internal Medicine

## 2021-04-18 ENCOUNTER — Other Ambulatory Visit: Payer: Self-pay

## 2021-04-18 VITALS — BP 132/76 | HR 103 | Resp 18 | Ht 63.0 in | Wt 174.1 lb

## 2021-04-18 DIAGNOSIS — Z0001 Encounter for general adult medical examination with abnormal findings: Secondary | ICD-10-CM | POA: Insufficient documentation

## 2021-04-18 DIAGNOSIS — F419 Anxiety disorder, unspecified: Secondary | ICD-10-CM | POA: Diagnosis not present

## 2021-04-18 DIAGNOSIS — R7303 Prediabetes: Secondary | ICD-10-CM

## 2021-04-18 DIAGNOSIS — E782 Mixed hyperlipidemia: Secondary | ICD-10-CM

## 2021-04-18 NOTE — Assessment & Plan Note (Signed)
Lipid profile reviewed Needs to strictly follow low cholesterol diet If persistently elevated, will add statin

## 2021-04-18 NOTE — Assessment & Plan Note (Signed)
Lab Results  Component Value Date   HGBA1C 6.4 (H) 01/27/2021   Needs to strictly follow low carb diet

## 2021-04-18 NOTE — Progress Notes (Signed)
Established Patient Office Visit  Subjective:  Patient ID: Susan Valdez, female    DOB: 10-02-1985  Age: 35 y.o. MRN: [id number]  CC:  Chief Complaint  Patient presents with   Annual Exam    Annual exam     HPI Susan Valdez is a 35 y.o. female with past medical history of HTN and HLD who presents for annual physical.  She continues to have worse fatigue. Of note, she did not start taking Cymbalta. She has been stressed at her workplace as well. She has had anhedonia and anger bursts at times. Denies any SI or HI.  Blood tests were reviewed and discussed with the patient in detail.  She denied flu and TDaP vaccines in the office today.  Past Medical History:  Diagnosis Date   Chlamydia    Hypertension    Increased serum lipids    Pre-diabetes    Toxemia in pregnancy    resolved    Past Surgical History:  Procedure Laterality Date   CESAREAN SECTION     x1   FIBULA FRACTURE SURGERY Right    LAPAROSCOPIC BILATERAL SALPINGECTOMY Bilateral 04/12/2016   Procedure: LAPAROSCOPIC BILATERAL SALPINGECTOMY;  Surgeon: Florian Buff, MD;  Location: [facility];  Service: Gynecology;  Laterality: Bilateral;   LYSIS OF ADHESION N/A 04/12/2016   Procedure: LYSIS OF ADHESION;  Surgeon: Florian Buff, MD;  Location: [facility];  Service: Gynecology;  Laterality: N/A;    Family History  Problem Relation Age of Onset   Hypertension Mother    Diabetes Mother    Gestational diabetes Other     Social History   Socioeconomic History   Marital status: Single    Spouse name: Not on file   Number of children: Not on file   Years of education: Not on file   Highest education level: Not on file  Occupational History   Not on file  Tobacco Use   Smoking status: Former    Types: Cigarettes   Smokeless tobacco: Never  Vaping Use   Vaping Use: Every day  Substance and Sexual Activity   Alcohol use: Yes    Comment: occas   Drug use: No   Sexual activity: Yes    Birth control/protection:  Pill  Other Topics Concern   Not on file  Social History Narrative   Not on file   Social Determinants of Health   Financial Resource Strain: Not on file  Food Insecurity: Not on file  Transportation Needs: Not on file  Physical Activity: Not on file  Stress: Not on file  Social Connections: Not on file  Intimate Partner Violence: Not on file    Outpatient Medications Prior to Visit  Medication Sig Dispense Refill   DULoxetine (CYMBALTA) 30 MG capsule Take 1 capsule (30 mg total) by mouth daily. 30 capsule 2   famotidine (PEPCID) 20 MG tablet Take 1 tablet (20 mg total) by mouth 2 (two) times daily. 60 tablet 5   hydrocortisone (ANUSOL-HC) 2.5 % rectal cream Place 1 application rectally 2 (two) times daily. (Patient taking differently: Place 1 application rectally 2 (two) times daily. As needed) 30 g 0   lisinopril (ZESTRIL) 10 MG tablet TAKE (1) TABLET BY MOUTH ONCE DAILY. 90 tablet 0   polyethylene glycol-electrolytes (NULYTELY) 420 g solution As directed 4000 mL 0   No facility-administered medications prior to visit.    Allergies  Allergen Reactions   Penicillins Hives    Has patient had a PCN reaction  causing immediate rash, facial/tongue/throat swelling, SOB or lightheadedness with hypotension:unsure Has patient had a PCN reaction causing severe rash involving mucus membranes or skin necrosis:unsure Has patient had a PCN reaction that required hospitalization:No Has patient had a PCN reaction occurring within the last 10 years:No If all of the above answers are "NO", then may proceed with Cephalosporin use. Childhood reaction    ROS Review of Systems  Constitutional:  Positive for fatigue. Negative for chills and fever.  HENT:  Negative for congestion, sinus pressure, sinus pain and sore throat.   Eyes:  Negative for pain and discharge.  Respiratory:  Negative for cough and shortness of breath.   Cardiovascular:  Negative for chest pain and palpitations.   Gastrointestinal:  Negative for abdominal pain, constipation, diarrhea, nausea and vomiting.  Endocrine: Negative for polydipsia and polyuria.  Genitourinary:  Negative for dysuria and hematuria.  Musculoskeletal:  Negative for neck pain and neck stiffness.  Skin:  Negative for rash.  Neurological:  Negative for dizziness, syncope, weakness and headaches.  Psychiatric/Behavioral:  Positive for dysphoric mood. Negative for agitation and behavioral problems. The patient is nervous/anxious.      Objective:    Physical Exam Vitals reviewed.  Constitutional:      General: She is not in acute distress.    Appearance: She is obese. She is not diaphoretic.  HENT:     Head: Normocephalic and atraumatic.     Nose: Nose normal.     Mouth/Throat:     Mouth: Mucous membranes are moist.  Eyes:     General: No scleral icterus.    Extraocular Movements: Extraocular movements intact.  Cardiovascular:     Rate and Rhythm: Normal rate and regular rhythm.     Pulses: Normal pulses.     Heart sounds: Normal heart sounds. No murmur heard. Pulmonary:     Breath sounds: Normal breath sounds. No wheezing or rales.  Abdominal:     Palpations: Abdomen is soft.     Tenderness: There is no abdominal tenderness.  Musculoskeletal:     Cervical back: Neck supple. No tenderness.     Right lower leg: No edema.     Left lower leg: No edema.  Skin:    General: Skin is warm.     Findings: No rash.  Neurological:     General: No focal deficit present.     Mental Status: She is alert and oriented to person, place, and time.     Cranial Nerves: No cranial nerve deficit.     Sensory: No sensory deficit.     Motor: No weakness.  Psychiatric:        Mood and Affect: Mood normal.        Behavior: Behavior normal.    BP 132/76 (BP Location: Left Arm, Patient Position: Sitting, Cuff Size: Normal)    Pulse (!) 103    Resp 18    Ht 5' 3"  (1.6 m)    Wt 174 lb 1.3 oz (79 kg)    SpO2 96%    BMI 30.84 kg/m  Wt  Readings from Last 3 Encounters:  04/18/21 174 lb 1.3 oz (79 kg)  01/24/21 173 lb 1.9 oz (78.5 kg)  10/28/20 172 lb 3.2 oz (78.1 kg)    Lab Results  Component Value Date   TSH 0.902 01/27/2021   Lab Results  Component Value Date   WBC 6.5 01/27/2021   HGB 12.8 01/27/2021   HCT 37.4 01/27/2021   MCV 91 01/27/2021  PLT 327 01/27/2021   Lab Results  Component Value Date   NA 139 01/27/2021   K 4.4 01/27/2021   CO2 20 01/27/2021   GLUCOSE 134 (H) 01/27/2021   BUN 8 01/27/2021   CREATININE 0.69 01/27/2021   BILITOT 0.5 01/27/2021   ALKPHOS 59 01/27/2021   AST 21 01/27/2021   ALT 19 01/27/2021   PROT 6.8 01/27/2021   ALBUMIN 4.7 01/27/2021   CALCIUM 9.5 01/27/2021   ANIONGAP 9 04/05/2016   EGFR 117 01/27/2021   Lab Results  Component Value Date   CHOL 225 (H) 01/27/2021   Lab Results  Component Value Date   HDL 31 (L) 01/27/2021   Lab Results  Component Value Date   LDLCALC 154 (H) 01/27/2021   Lab Results  Component Value Date   TRIG 215 (H) 01/27/2021   Lab Results  Component Value Date   CHOLHDL 7.3 (H) 01/27/2021   Lab Results  Component Value Date   HGBA1C 6.4 (H) 01/27/2021      Assessment & Plan:   Encounter for general adult medical examination with abnormal findings Physical exam as documented. Counseling done  re healthy lifestyle involving commitment to 150 minutes exercise per week, heart healthy diet, and attaining healthy weight.The importance of adequate sleep also discussed. Changes in health habits are decided on by the patient with goals and time frames  set for achieving them. Immunization and cancer screening needs are specifically addressed at this visit.  Anxiety Some of her symptoms are likely related to anxiety Needs to start taking Cymbalta considering her chronic fatigue  Prediabetes Lab Results  Component Value Date   HGBA1C 6.4 (H) 01/27/2021   Needs to strictly follow low carb diet  Mixed hyperlipidemia Lipid  profile reviewed Needs to strictly follow low cholesterol diet If persistently elevated, will add statin    No orders of the defined types were placed in this encounter.   Follow-up: Return in about 4 months (around 08/17/2021) for HTN, prediabetes and HLD.    Lindell Spar, MD

## 2021-04-18 NOTE — Assessment & Plan Note (Signed)

## 2021-04-18 NOTE — Assessment & Plan Note (Signed)
Some of her symptoms are likely related to anxiety Needs to start taking Cymbalta considering her chronic fatigue 

## 2021-04-18 NOTE — Patient Instructions (Addendum)
Please start taking Cymbalta as prescribed.  Please start taking Vitamin D 5000 IU for 3 months and then 2000 IU once daily.  Please follow low carb and low salt diet and perform moderate exercise/walking at least 150 mins/week.

## 2021-05-01 DIAGNOSIS — Z419 Encounter for procedure for purposes other than remedying health state, unspecified: Secondary | ICD-10-CM | POA: Diagnosis not present

## 2021-05-17 ENCOUNTER — Encounter: Payer: Self-pay | Admitting: Emergency Medicine

## 2021-05-17 ENCOUNTER — Other Ambulatory Visit: Payer: Self-pay

## 2021-05-17 ENCOUNTER — Ambulatory Visit
Admission: EM | Admit: 2021-05-17 | Discharge: 2021-05-17 | Disposition: A | Discharge status: Home / Self Care | Payer: Medicaid Other | Attending: Student | Admitting: Student

## 2021-05-17 DIAGNOSIS — J208 Acute bronchitis due to other specified organisms: Secondary | ICD-10-CM

## 2021-05-17 MED ORDER — PREDNISONE 10 MG (21) PO TBPK: ORAL_TABLET | Freq: Every day | ORAL | 0 refills | Status: DC

## 2021-05-17 MED ORDER — BENZONATATE 100 MG PO CAPS: 100.0000 mg | ORAL_CAPSULE | Freq: Three times a day (TID) | ORAL | 0 refills | Status: DC

## 2021-05-17 MED ORDER — ALBUTEROL SULFATE HFA 108 (90 BASE) MCG/ACT IN AERS: 1.0000 | INHALATION_SPRAY | Freq: Four times a day (QID) | RESPIRATORY_TRACT | 0 refills | Status: DC | PRN

## 2021-05-17 NOTE — ED Provider Notes (Signed)
[facility]    CSN: [id number] Arrival date & time: 05/17/21  1641      History   Chief Complaint No chief complaint on file.   HPI MIREYLI VERDINE is a 36 y.o. female presenting with cough x7 days. Medical history prediabetes, denies history pulm ds. Cough is dry - nonproductive. 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 Initially with sore throat, this has resolved. Denies SOB, fevers/chills, CP, n/v/d/c. Has attempted alleve once. Denies hemoptysis.   HPI  Past Medical History:  Diagnosis Date   Chlamydia    Hypertension    Increased serum lipids    Pre-diabetes    Toxemia in pregnancy    resolved    Patient Active Problem List   Diagnosis Date Noted   Encounter for general adult medical examination with abnormal findings 04/18/2021   Prediabetes 04/18/2021   Anxiety 01/24/2021   Fatigue 08/26/2020   Hemorrhoid 06/28/2020   Transaminitis 02/16/2020   Hypertension 01/26/2020   Mixed hyperlipidemia 01/26/2020   Vapes nicotine containing substance 01/26/2020    Past Surgical History:  Procedure Laterality Date   CESAREAN SECTION     x1   FIBULA FRACTURE SURGERY Right    LAPAROSCOPIC BILATERAL SALPINGECTOMY Bilateral 04/12/2016   Procedure: LAPAROSCOPIC BILATERAL SALPINGECTOMY;  Surgeon: Florian Buff, MD;  Location: [facility];  Service: Gynecology;  Laterality: Bilateral;   LYSIS OF ADHESION N/A 04/12/2016   Procedure: LYSIS OF ADHESION;  Surgeon: Florian Buff, MD;  Location: [facility];  Service: Gynecology;  Laterality: N/A;    OB History     Gravida  1   Para  1   Term      Preterm      AB      Living  1      SAB      IAB      Ectopic      Multiple      Live Births               Home Medications    Prior to Admission medications   Medication Sig Start Date End Date Taking? Authorizing Provider  albuterol (VENTOLIN HFA) 108 (90 Base) MCG/ACT inhaler Inhale 1-2 puffs into the lungs  every 6 (six) hours as needed for wheezing or shortness of breath. 05/17/21  Yes Hazel Sams, PA-C  benzonatate (TESSALON) 100 MG capsule Take 1 capsule (100 mg total) by mouth every 8 (eight) hours. 05/17/21  Yes Hazel Sams, PA-C  predniSONE (STERAPRED UNI-PAK 21 TAB) 10 MG (21) TBPK tablet Take by mouth daily. Take 6 tabs by mouth daily  for 2 days, then 5 tabs for 2 days, then 4 tabs for 2 days, then 3 tabs for 2 days, 2 tabs for 2 days, then 1 tab by mouth daily for 2 days 05/17/21  Yes Phillip Heal, Sherlon Handing, PA-C  DULoxetine (CYMBALTA) 30 MG capsule Take 1 capsule (30 mg total) by mouth daily. 01/24/21   Lindell Spar, MD  hydrocortisone (ANUSOL-HC) 2.5 % rectal cream Place 1 application rectally 2 (two) times daily. Patient taking differently: Place 1 application rectally 2 (two) times daily. As needed 06/28/20   Noreene Larsson, NP  lisinopril (ZESTRIL) 10 MG tablet TAKE (1) TABLET BY MOUTH ONCE DAILY. 03/16/21   Lindell Spar, MD  polyethylene glycol-electrolytes (NULYTELY) 420 g solution As directed 10/28/20   Eloise Harman, DO    Family History  Family History  Problem Relation Age of Onset   Hypertension Mother    Diabetes Mother    Gestational diabetes Other     Social History Social History   Tobacco Use   Smoking status: Former    Types: Cigarettes   Smokeless tobacco: Never  Vaping Use   Vaping Use: Every day  Substance Use Topics   Alcohol use: Yes    Comment: occas   Drug use: No     Allergies   Penicillins   Review of Systems Review of Systems  Constitutional:  Negative for appetite change, chills and fever.  HENT:  Negative for congestion, ear pain, rhinorrhea, sinus pressure, sinus pain and sore throat.   Eyes:  Negative for redness and visual disturbance.  Respiratory:  Positive for cough. Negative for chest tightness, shortness of breath and wheezing.   Cardiovascular:  Negative for chest pain and palpitations.  Gastrointestinal:  Negative for  abdominal pain, constipation, diarrhea, nausea and vomiting.  Genitourinary:  Negative for dysuria, frequency and urgency.  Musculoskeletal:  Negative for myalgias.  Neurological:  Negative for dizziness, weakness and headaches.  Psychiatric/Behavioral:  Negative for confusion.   All other systems reviewed and are negative.   Physical Exam Triage Vital Signs ED Triage Vitals  Enc Vitals Group     BP      Pulse      Resp      Temp      Temp src      SpO2      Weight      Height      Head Circumference      Peak Flow      Pain Score      Pain Loc      Pain Edu?      Excl. in GC?    No data found.  Updated Vital Signs BP 117/71 (BP Location: Right Arm)    Pulse (!) 110    Temp 98.2 F (36.8 C) (Oral)    Resp 18    LMP 04/22/2021 (Approximate)    SpO2 95%   Visual Acuity Right Eye Distance:   Left Eye Distance:   Bilateral Distance:    Right Eye Near:   Left Eye Near:    Bilateral Near:     Physical Exam Vitals reviewed.  Constitutional:      General: She is not in acute distress.    Appearance: Normal appearance. She is not ill-appearing.  HENT:     Head: Normocephalic and atraumatic.     Right Ear: Tympanic membrane, ear canal and external ear normal. No tenderness. No middle ear effusion. There is no impacted cerumen. Tympanic membrane is not perforated, erythematous, retracted or bulging.     Left Ear: Tympanic membrane, ear canal and external ear normal. No tenderness.  No middle ear effusion. There is no impacted cerumen. Tympanic membrane is not perforated, erythematous, retracted or bulging.     Nose: Nose normal. No congestion.     Mouth/Throat:     Mouth: Mucous membranes are moist.     Pharynx: Uvula midline. No oropharyngeal exudate or posterior oropharyngeal erythema.  Eyes:     Extraocular Movements: Extraocular movements intact.     Pupils: Pupils are equal, round, and reactive to light.  Cardiovascular:     Rate and Rhythm: Normal rate and  regular rhythm.     Heart sounds: Normal heart sounds.  Pulmonary:     Effort: Pulmonary effort is normal.  Breath sounds: Normal breath sounds. No decreased breath sounds, wheezing, rhonchi or rales.     Comments: Freq hacking cough  Abdominal:     Palpations: Abdomen is soft.     Tenderness: There is no abdominal tenderness. There is no guarding or rebound.  Lymphadenopathy:     Cervical: No cervical adenopathy.     Right cervical: No superficial cervical adenopathy.    Left cervical: No superficial cervical adenopathy.  Neurological:     General: No focal deficit present.     Mental Status: She is alert and oriented to person, place, and time.  Psychiatric:        Mood and Affect: Mood normal.        Behavior: Behavior normal.        Thought Content: Thought content normal.        Judgment: Judgment normal.     UC Treatments / Results  Labs (all labs ordered are listed, but only abnormal results are displayed) Labs Reviewed - No data to display  EKG   Radiology No results found.  Procedures Procedures (including critical care time)  Medications Ordered in UC Medications - No data to display  Initial Impression / Assessment and Plan / UC Course  I have reviewed the triage vital signs and the nursing notes.  Pertinent labs & imaging results that were available during my care of the patient were reviewed by me and considered in my medical decision making (see chart for details).     This patient is a very pleasant 36 y.o. year old female presenting with viral bronchitis. Afebrile, but tachy at 110- on manual recheck this is 98. LMP 04/22/21, States she is not pregnant or breastfeeding.  Low suspicion for pneumonia given clear lung sounds, normal vitals, no history pulm ds.   Negative home covid test. Declines influenza testing today, I am in agreement given duration of symptoms.   Prednisone taper, albuterol inhaler, tessalon.   ED return precautions  discussed. Patient verbalizes understanding and agreement.   Coding Level 4 for acute illness with systemic symptoms, and prescription drug management   Final Clinical Impressions(s) / UC Diagnoses   Final diagnoses:  Viral bronchitis     Discharge Instructions      -You have bronchitis. Bronchitis is an inflammation of the lining of your bronchial tubes, which carry air to and from your lungs. This typically occurs after a virus, like a cold virus. People who have bronchitis often cough up thickened mucus, which can be discolored. This isn't a bacterial infection, so you don't need antibiotics. We treat it with medications to help reduce inflammation and open up your lungs.  -Prednisone, 2 pills taken at the same time for 5 days in a row.  Try taking this earlier in the day as it can give you energy. Avoid NSAIDs like ibuprofen and alleve while taking this medication as they can increase your risk of stomach upset and even GI bleeding when in combination with a steroid. You can continue tylenol (acetaminophen) up to 1000mg  3x daily. -Tessalon (Benzonatate) as needed for cough. Take one pill up to 3x daily (every 8 hours) -Albuterol inhaler as needed for cough, wheezing, shortness of breath, 1 to 2 puffs every 6 hours as needed. -You can try over-the-counter medications like Mucinex --With a virus, you're typically contagious for 5-7 days, or as long as you're having fevers. You probably aren't contagious anymore.  -Follow-up if symptoms getting worse instead of better- new fevers, shortness of  breath, chest pain, dizziness, etc.     ED Prescriptions     Medication Sig Dispense Auth. Provider   predniSONE (STERAPRED UNI-PAK 21 TAB) 10 MG (21) TBPK tablet Take by mouth daily. Take 6 tabs by mouth daily  for 2 days, then 5 tabs for 2 days, then 4 tabs for 2 days, then 3 tabs for 2 days, 2 tabs for 2 days, then 1 tab by mouth daily for 2 days 42 tablet Hazel Sams, PA-C   benzonatate  (TESSALON) 100 MG capsule Take 1 capsule (100 mg total) by mouth every 8 (eight) hours. 21 capsule Hazel Sams, PA-C   albuterol (VENTOLIN HFA) 108 (90 Base) MCG/ACT inhaler Inhale 1-2 puffs into the lungs every 6 (six) hours as needed for wheezing or shortness of breath. 1 each Hazel Sams, PA-C      PDMP not reviewed this encounter.   Hazel Sams, PA-C 05/17/21 1703

## 2021-05-17 NOTE — ED Triage Notes (Signed)
Dry Cough x 1 week.

## 2021-05-17 NOTE — Discharge Instructions (Addendum)
-  You have bronchitis. Bronchitis is an inflammation of the lining of your bronchial tubes, which carry air to and from your lungs. This typically occurs after a virus, like a cold virus. People who have bronchitis often cough up thickened mucus, which can be discolored. This isn't a bacterial infection, so you don't need antibiotics. We treat it with medications to help reduce inflammation and open up your lungs.  -Prednisone, 2 pills taken at the same time for 5 days in a row.  Try taking this earlier in the day as it can give you energy. Avoid NSAIDs like ibuprofen and alleve while taking this medication as they can increase your risk of stomach upset and even GI bleeding when in combination with a steroid. You can continue tylenol (acetaminophen) up to 1000mg  3x daily. -Tessalon (Benzonatate) as needed for cough. Take one pill up to 3x daily (every 8 hours) -Albuterol inhaler as needed for cough, wheezing, shortness of breath, 1 to 2 puffs every 6 hours as needed. -You can try over-the-counter medications like Mucinex --With a virus, you're typically contagious for 5-7 days, or as long as you're having fevers. You probably aren't contagious anymore.  -Follow-up if symptoms getting worse instead of better- new fevers, shortness of breath, chest pain, dizziness, etc.

## 2021-06-01 DIAGNOSIS — Z419 Encounter for procedure for purposes other than remedying health state, unspecified: Secondary | ICD-10-CM | POA: Diagnosis not present

## 2021-06-27 ENCOUNTER — Other Ambulatory Visit: Payer: Self-pay | Admitting: *Deleted

## 2021-06-27 ENCOUNTER — Telehealth: Payer: Self-pay

## 2021-06-27 ENCOUNTER — Other Ambulatory Visit: Payer: Self-pay | Admitting: Internal Medicine

## 2021-06-27 DIAGNOSIS — I10 Essential (primary) hypertension: Secondary | ICD-10-CM

## 2021-06-27 MED ORDER — LISINOPRIL 10 MG PO TABS: ORAL_TABLET | ORAL | 0 refills | Status: DC

## 2021-06-27 NOTE — Telephone Encounter (Signed)
Medication sent to pharmacy  

## 2021-06-27 NOTE — Telephone Encounter (Signed)
Need med refills  lisinopril (ZESTRIL) 10 MG tablet   [employer]

## 2021-06-29 DIAGNOSIS — Z419 Encounter for procedure for purposes other than remedying health state, unspecified: Secondary | ICD-10-CM | POA: Diagnosis not present

## 2021-07-30 DIAGNOSIS — Z419 Encounter for procedure for purposes other than remedying health state, unspecified: Secondary | ICD-10-CM | POA: Diagnosis not present

## 2021-08-15 ENCOUNTER — Ambulatory Visit (INDEPENDENT_AMBULATORY_CARE_PROVIDER_SITE_OTHER): Payer: Medicaid Other | Admitting: Internal Medicine

## 2021-08-15 ENCOUNTER — Encounter: Payer: Self-pay | Admitting: Internal Medicine

## 2021-08-15 VITALS — BP 124/78 | HR 99 | Resp 18 | Ht 63.0 in | Wt 178.8 lb

## 2021-08-15 DIAGNOSIS — R0683 Snoring: Secondary | ICD-10-CM

## 2021-08-15 DIAGNOSIS — I10 Essential (primary) hypertension: Secondary | ICD-10-CM

## 2021-08-15 DIAGNOSIS — E782 Mixed hyperlipidemia: Secondary | ICD-10-CM | POA: Diagnosis not present

## 2021-08-15 DIAGNOSIS — R7303 Prediabetes: Secondary | ICD-10-CM | POA: Diagnosis not present

## 2021-08-15 DIAGNOSIS — E1169 Type 2 diabetes mellitus with other specified complication: Secondary | ICD-10-CM | POA: Diagnosis not present

## 2021-08-15 DIAGNOSIS — R5382 Chronic fatigue, unspecified: Secondary | ICD-10-CM

## 2021-08-15 MED ORDER — DULOXETINE HCL 30 MG PO CPEP: 30.0000 mg | ORAL_CAPSULE | Freq: Every day | ORAL | 5 refills | Status: DC

## 2021-08-15 NOTE — Assessment & Plan Note (Signed)
Needs to strictly follow low cholesterol diet ?If persistently elevated, will add statin ?

## 2021-08-15 NOTE — Patient Instructions (Signed)
Please continue to take medications as prescribed. ? ?Please continue to follow low carb diet and perform moderate exercise/walking at least 150 mins/week. ?

## 2021-08-15 NOTE — Progress Notes (Addendum)
? ?Established Patient Office Visit ? ?Subjective:  ?Patient ID: Susan Valdez, female    DOB: 1985/06/05  Age: 36 y.o. MRN: [id number] ? ?CC:  ?Chief Complaint  ?Patient presents with  ? Follow-up  ?  4 month follow up HTN prediabetes HLD   ? ? ?HPI ?Susan Valdez is a 36 y.o. female with past medical history of HTN, prediabetes and HLD who presents for f/u of her chronic medical conditions. ? ?She continues to have mild fatigue. Of note, she has not been taking Cymbalta regularly. She has been stressed at her workplace as well. She has had anhedonia and anger bursts at times. Denies any SI or HI. ? ?HTN: BP is well-controlled. Takes medications regularly. Patient denies headache, dizziness, chest pain, dyspnea or palpitations. ? ?She reports snoring at nighttime, but denies any insomnia, daytime sleepiness or apnea episodes. ? ?Past Medical History:  ?Diagnosis Date  ? Chlamydia   ? Hypertension   ? Increased serum lipids   ? Pre-diabetes   ? Toxemia in pregnancy   ? resolved  ? ? ?Past Surgical History:  ?Procedure Laterality Date  ? CESAREAN SECTION    ? x1  ? FIBULA FRACTURE SURGERY Right   ? LAPAROSCOPIC BILATERAL SALPINGECTOMY Bilateral 04/12/2016  ? Procedure: LAPAROSCOPIC BILATERAL SALPINGECTOMY;  Surgeon: Florian Buff, MD;  Location: [facility];  Service: Gynecology;  Laterality: Bilateral;  ? LYSIS OF ADHESION N/A 04/12/2016  ? Procedure: LYSIS OF ADHESION;  Surgeon: Florian Buff, MD;  Location: [facility];  Service: Gynecology;  Laterality: N/A;  ? ? ?Family History  ?Problem Relation Age of Onset  ? Hypertension Mother   ? Diabetes Mother   ? Gestational diabetes Other   ? ? ?Social History  ? ?Socioeconomic History  ? Marital status: Single  ?  Spouse name: Not on file  ? Number of children: Not on file  ? Years of education: Not on file  ? Highest education level: Not on file  ?Occupational History  ? Not on file  ?Tobacco Use  ? Smoking status: Former  ?  Types: Cigarettes  ? Smokeless tobacco: Never   ?Vaping Use  ? Vaping Use: Every day  ?Substance and Sexual Activity  ? Alcohol use: Yes  ?  Comment: occas  ? Drug use: No  ? Sexual activity: Yes  ?  Birth control/protection: Pill  ?Other Topics Concern  ? Not on file  ?Social History Narrative  ? Not on file  ? ?Social Determinants of Health  ? ?Financial Resource Strain: Not on file  ?Food Insecurity: Not on file  ?Transportation Needs: Not on file  ?Physical Activity: Not on file  ?Stress: Not on file  ?Social Connections: Not on file  ?Intimate Partner Violence: Not on file  ? ? ?Outpatient Medications Prior to Visit  ?Medication Sig Dispense Refill  ? albuterol (VENTOLIN HFA) 108 (90 Base) MCG/ACT inhaler Inhale 1-2 puffs into the lungs every 6 (six) hours as needed for wheezing or shortness of breath. 1 each 0  ? hydrocortisone (ANUSOL-HC) 2.5 % rectal cream Place 1 application rectally 2 (two) times daily. (Patient taking differently: Place 1 application. rectally 2 (two) times daily. As needed) 30 g 0  ? lisinopril (ZESTRIL) 10 MG tablet TAKE (1) TABLET BY MOUTH ONCE DAILY. 90 tablet 0  ? polyethylene glycol-electrolytes (NULYTELY) 420 g solution As directed 4000 mL 0  ? DULoxetine (CYMBALTA) 30 MG capsule Take 1 capsule (30 mg total) by mouth daily. 30 capsule 2  ?  benzonatate (TESSALON) 100 MG capsule Take 1 capsule (100 mg total) by mouth every 8 (eight) hours. (Patient not taking: Reported on 08/15/2021) 21 capsule 0  ? predniSONE (STERAPRED UNI-PAK 21 TAB) 10 MG (21) TBPK tablet Take by mouth daily. Take 6 tabs by mouth daily  for 2 days, then 5 tabs for 2 days, then 4 tabs for 2 days, then 3 tabs for 2 days, 2 tabs for 2 days, then 1 tab by mouth daily for 2 days (Patient not taking: Reported on 08/15/2021) 42 tablet 0  ? ?No facility-administered medications prior to visit.  ? ? ?Allergies  ?Allergen Reactions  ? Penicillins Hives  ?  Has patient had a PCN reaction causing immediate rash, facial/tongue/throat swelling, SOB or lightheadedness with  hypotension:unsure ?Has patient had a PCN reaction causing severe rash involving mucus membranes or skin necrosis:unsure ?Has patient had a PCN reaction that required hospitalization:No ?Has patient had a PCN reaction occurring within the last 10 years:No ?If all of the above answers are "NO", then may proceed with Cephalosporin use. ?Childhood reaction  ? ? ?ROS ?Review of Systems  ?Constitutional:  Positive for fatigue. Negative for chills and fever.  ?HENT:  Negative for congestion, sinus pressure, sinus pain and sore throat.   ?Eyes:  Negative for pain and discharge.  ?Respiratory:  Negative for cough and shortness of breath.   ?Cardiovascular:  Negative for chest pain and palpitations.  ?Gastrointestinal:  Negative for abdominal pain, constipation, diarrhea, nausea and vomiting.  ?Endocrine: Negative for polydipsia and polyuria.  ?Genitourinary:  Negative for dysuria and hematuria.  ?Musculoskeletal:  Negative for neck pain and neck stiffness.  ?Skin:  Negative for rash.  ?Neurological:  Negative for dizziness, syncope, weakness and headaches.  ?Psychiatric/Behavioral:  Positive for dysphoric mood. Negative for agitation and behavioral problems. The patient is nervous/anxious.   ? ?  ?Objective:  ?  ?Physical Exam ?Vitals reviewed.  ?Constitutional:   ?   General: She is not in acute distress. ?   Appearance: She is obese. She is not diaphoretic.  ?HENT:  ?   Head: Normocephalic and atraumatic.  ?   Nose: Nose normal.  ?   Mouth/Throat:  ?   Mouth: Mucous membranes are moist.  ?Eyes:  ?   General: No scleral icterus. ?   Extraocular Movements: Extraocular movements intact.  ?Cardiovascular:  ?   Rate and Rhythm: Normal rate and regular rhythm.  ?   Pulses: Normal pulses.  ?   Heart sounds: Normal heart sounds. No murmur heard. ?Pulmonary:  ?   Breath sounds: Normal breath sounds. No wheezing or rales.  ?Abdominal:  ?   Palpations: Abdomen is soft.  ?   Tenderness: There is no abdominal tenderness.   ?Musculoskeletal:  ?   Cervical back: Neck supple. No tenderness.  ?   Right lower leg: No edema.  ?   Left lower leg: No edema.  ?Skin: ?   General: Skin is warm.  ?   Findings: No rash.  ?Neurological:  ?   General: No focal deficit present.  ?   Mental Status: She is alert and oriented to person, place, and time.  ?   Cranial Nerves: No cranial nerve deficit.  ?   Sensory: No sensory deficit.  ?   Motor: No weakness.  ?Psychiatric:     ?   Mood and Affect: Mood normal.     ?   Behavior: Behavior normal.  ? ? ?BP 124/78 (BP Location: Left Arm,  Patient Position: Sitting, Cuff Size: Normal)   Pulse 99   Resp 18   Ht 5' 3" (1.6 m)   Wt 178 lb 12.8 oz (81.1 kg)   SpO2 96%   BMI 31.67 kg/m?  ?Wt Readings from Last 3 Encounters:  ?08/15/21 178 lb 12.8 oz (81.1 kg)  ?04/18/21 174 lb 1.3 oz (79 kg)  ?01/24/21 173 lb 1.9 oz (78.5 kg)  ? ? ?Lab Results  ?Component Value Date  ? TSH 0.902 01/27/2021  ? ?Lab Results  ?Component Value Date  ? WBC 6.5 01/27/2021  ? HGB 12.8 01/27/2021  ? HCT 37.4 01/27/2021  ? MCV 91 01/27/2021  ? PLT 327 01/27/2021  ? ?Lab Results  ?Component Value Date  ? NA 139 08/15/2021  ? K 4.8 08/15/2021  ? CO2 23 08/15/2021  ? GLUCOSE 146 (H) 08/15/2021  ? BUN 7 08/15/2021  ? CREATININE 0.57 08/15/2021  ? BILITOT <0.2 08/15/2021  ? ALKPHOS 60 08/15/2021  ? AST 19 08/15/2021  ? ALT 22 08/15/2021  ? PROT 6.1 08/15/2021  ? ALBUMIN 4.2 08/15/2021  ? CALCIUM 9.7 08/15/2021  ? ANIONGAP 9 04/05/2016  ? EGFR 122 08/15/2021  ? ?Lab Results  ?Component Value Date  ? CHOL 248 (H) 08/15/2021  ? ?Lab Results  ?Component Value Date  ? HDL 32 (L) 08/15/2021  ? ?Lab Results  ?Component Value Date  ? LDLCALC 153 (H) 08/15/2021  ? ?Lab Results  ?Component Value Date  ? TRIG 336 (H) 08/15/2021  ? ?Lab Results  ?Component Value Date  ? CHOLHDL 7.8 (H) 08/15/2021  ? ?Lab Results  ?Component Value Date  ? HGBA1C 7.0 (H) 08/15/2021  ? ? ?  ?Assessment & Plan:  ? ?Problem List Items Addressed This Visit   ? ?  ?  Cardiovascular and Mediastinum  ? Hypertension  ?  BP Readings from Last 1 Encounters:  ?08/15/21 124/78  ?Well-controlled with Lisinopril ?Counseled for compliance with the medications ?Advised DASH diet and moderate exercise/walking

## 2021-08-15 NOTE — Assessment & Plan Note (Signed)
With fatigue, which is chronic ?Denies any gasping episode, gets good quality sleep ?If persistent fatigue, can consider to get sleep study ?

## 2021-08-15 NOTE — Assessment & Plan Note (Signed)
Chronic fatigue, could be multifactorial Had COVID infection, has had fatigue since then Had been changing jobs recently, which has been stressful for her Also reports mild anhedonia and anger bursts at times On Cymbalta, advised to take it regularly 

## 2021-08-15 NOTE — Assessment & Plan Note (Signed)
Lab Results  °Component Value Date  ° HGBA1C 6.4 (H) 01/27/2021  ° °Needs to strictly follow low carb diet °

## 2021-08-15 NOTE — Assessment & Plan Note (Signed)
BP Readings from Last 1 Encounters:  ?08/15/21 124/78  ? ?Well-controlled with Lisinopril ?Counseled for compliance with the medications ?Advised DASH diet and moderate exercise/walking, at least 150 mins/week ?

## 2021-08-16 ENCOUNTER — Telehealth: Payer: Self-pay | Admitting: Internal Medicine

## 2021-08-16 DIAGNOSIS — E119 Type 2 diabetes mellitus without complications: Secondary | ICD-10-CM | POA: Insufficient documentation

## 2021-08-16 DIAGNOSIS — E1169 Type 2 diabetes mellitus with other specified complication: Secondary | ICD-10-CM | POA: Insufficient documentation

## 2021-08-16 LAB — CMP14+EGFR
ALT: 22 IU/L (ref 0–32)
AST: 19 IU/L (ref 0–40)
Albumin/Globulin Ratio: 2.2 (ref 1.2–2.2)
Albumin: 4.2 g/dL (ref 3.8–4.8)
Alkaline Phosphatase: 60 IU/L (ref 44–121)
BUN/Creatinine Ratio: 12 (ref 9–23)
BUN: 7 mg/dL (ref 6–20)
Bilirubin Total: <0.2 mg/dL (ref 0.0–1.2)
CO2: 23 mmol/L (ref 20–29)
Calcium: 9.7 mg/dL (ref 8.7–10.2)
Chloride: 104 mmol/L (ref 96–106)
Creatinine, Ser: 0.57 mg/dL (ref 0.57–1.00)
Globulin, Total: 1.9 g/dL (ref 1.5–4.5)
Glucose: 146 mg/dL — ABNORMAL HIGH (ref 70–99)
Potassium: 4.8 mmol/L (ref 3.5–5.2)
Sodium: 139 mmol/L (ref 134–144)
Total Protein: 6.1 g/dL (ref 6.0–8.5)
eGFR: 122 mL/min/{1.73_m2} (ref >59)

## 2021-08-16 LAB — LIPID PANEL
Chol/HDL Ratio: 7.8 ratio — ABNORMAL HIGH (ref 0.0–4.4)
Cholesterol, Total: 248 mg/dL — ABNORMAL HIGH (ref 100–199)
HDL: 32 mg/dL — ABNORMAL LOW (ref >39)
LDL Chol Calc (NIH): 153 mg/dL — ABNORMAL HIGH (ref 0–99)
Triglycerides: 336 mg/dL — ABNORMAL HIGH (ref 0–149)
VLDL Cholesterol Cal: 63 mg/dL — ABNORMAL HIGH (ref 5–40)

## 2021-08-16 LAB — HEMOGLOBIN A1C
Est. average glucose Bld gHb Est-mCnc: 154 mg/dL
Hgb A1c MFr Bld: 7 % — ABNORMAL HIGH (ref 4.8–5.6)

## 2021-08-16 MED ORDER — METFORMIN HCL 500 MG PO TABS: 500.0000 mg | ORAL_TABLET | Freq: Every day | ORAL | 1 refills | Status: DC

## 2021-08-16 MED ORDER — ROSUVASTATIN CALCIUM 10 MG PO TABS: 10.0000 mg | ORAL_TABLET | Freq: Every day | ORAL | 1 refills | Status: DC

## 2021-08-16 NOTE — Telephone Encounter (Signed)
Pt advised with verbal understanding  °

## 2021-08-16 NOTE — Assessment & Plan Note (Signed)
Lab Results  ?Component Value Date  ? HGBA1C 7.0 (H) 08/15/2021  ? ? ?New onset ?Started Metformin ?Advised to follow diabetic diet ?On ACEi ?Started statin ?F/u CMP and lipid panel ?Diabetic eye exam: Advised to follow up with Ophthalmology for diabetic eye exam ?

## 2021-08-16 NOTE — Addendum Note (Signed)
Addended byTrena Platt on: 08/16/2021 08:11 AM ? ? Modules accepted: Orders, Level of Service ? ?

## 2021-08-16 NOTE — Telephone Encounter (Signed)
Patient return call for lab results 

## 2021-08-24 ENCOUNTER — Encounter: Payer: Self-pay | Admitting: *Deleted

## 2021-08-24 ENCOUNTER — Ambulatory Visit: Payer: Medicaid Other | Admitting: Gastroenterology

## 2021-08-24 ENCOUNTER — Encounter: Payer: Self-pay | Admitting: Gastroenterology

## 2021-08-24 VITALS — BP 142/98 | HR 65 | Temp 97.8°F | Ht 63.0 in | Wt 173.6 lb

## 2021-08-24 DIAGNOSIS — K6289 Other specified diseases of anus and rectum: Secondary | ICD-10-CM

## 2021-08-24 DIAGNOSIS — K641 Second degree hemorrhoids: Secondary | ICD-10-CM | POA: Diagnosis not present

## 2021-08-24 MED ORDER — CLENPIQ 10-3.5-12 MG-GM -GM/160ML PO SOLN: 1.0000 | Freq: Once | ORAL | 0 refills | Status: AC

## 2021-08-24 NOTE — Patient Instructions (Signed)
I have called in a specialized cream to [facility]. They will call you when it is ready and get your insurance. You will need to use it 3-4 times a day. Wear gloves when applying and wash hands after. ? ?We are arranging a colonoscopy in the near future. We don't want to do banding until the possible rectal fissure is healed. ? ?We will see you in follow-up thereafter! ? ? ?

## 2021-08-24 NOTE — Progress Notes (Signed)
? ? ?Gastroenterology Office Note   ? ? ?Primary Care Physician:  Lindell Spar, MD  ?Primary Gastroenterologist: Dr. Abbey Chatters ? ? ?Chief Complaint  ? ?Chief Complaint  ?Patient presents with  ? Hemorrhoids  ? ? ? ?History of Present Illness  ? ?Susan Valdez is a 36 y.o. female presenting today in follow-up with a history of symptomatic hemorrhoids, previously seen in June 2022 with colonoscopy recommended in light of rectal bleeding. She did not complete this but returns today with persistent symptoms.  ? ?Notes persistent issues with hemorrhoids. Sometimes a rectal cramp. Itchy. Prolapsing tissue. Not as much blood any longer. No straining with BMs. 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 Sometimes discomfort with BMS.  ? ? ?Past Medical History:  ?Diagnosis Date  ? Chlamydia   ? Hypertension   ? Increased serum lipids   ? Pre-diabetes   ? Toxemia in pregnancy   ? resolved  ? ? ?Past Surgical History:  ?Procedure Laterality Date  ? CESAREAN SECTION    ? x1  ? FIBULA FRACTURE SURGERY Right   ? LAPAROSCOPIC BILATERAL SALPINGECTOMY Bilateral 04/12/2016  ? Procedure: LAPAROSCOPIC BILATERAL SALPINGECTOMY;  Surgeon: Florian Buff, MD;  Location: [facility];  Service: Gynecology;  Laterality: Bilateral;  ? LYSIS OF ADHESION N/A 04/12/2016  ? Procedure: LYSIS OF ADHESION;  Surgeon: Florian Buff, MD;  Location: [facility];  Service: Gynecology;  Laterality: N/A;  ? ? ?Current Outpatient Medications  ?Medication Sig Dispense Refill  ? DULoxetine (CYMBALTA) 30 MG capsule Take 1 capsule (30 mg total) by mouth daily. 30 capsule 5  ? hydrocortisone (ANUSOL-HC) 2.5 % rectal cream Place 1 application rectally 2 (two) times daily. (Patient taking differently: Place 1 application. rectally 2 (two) times daily. As needed) 30 g 0  ? lisinopril (ZESTRIL) 10 MG tablet TAKE (1) TABLET BY MOUTH ONCE DAILY. 90 tablet 0  ? metFORMIN (GLUCOPHAGE) 500 MG tablet Take 1 tablet (500 mg total) by mouth daily with breakfast.  90 tablet 1  ? rosuvastatin (CRESTOR) 10 MG tablet Take 1 tablet (10 mg total) by mouth daily. 90 tablet 1  ? ?No current facility-administered medications for this visit.  ? ? ?Allergies as of 08/24/2021 - Review Complete 08/24/2021  ?Allergen Reaction Noted  ? Penicillins Hives 03/11/2012  ? ? ?Family History  ?Problem Relation Age of Onset  ? Hypertension Mother   ? Diabetes Mother   ? Gestational diabetes Other   ? Colon cancer Neg Hx   ? Colon polyps Neg Hx   ? ? ?Social History  ? ?Socioeconomic History  ? Marital status: Single  ?  Spouse name: Not on file  ? Number of children: Not on file  ? Years of education: Not on file  ? Highest education level: Not on file  ?Occupational History  ? Not on file  ?Tobacco Use  ? Smoking status: Former  ?  Types: Cigarettes, E-cigarettes  ? Smokeless tobacco: Never  ? Tobacco comments:  ?  Only vapes  ?Vaping Use  ? Vaping Use: Every day  ?Substance and Sexual Activity  ? Alcohol use: Yes  ?  Comment: occas  ? Drug use: No  ? Sexual activity: Yes  ?  Birth control/protection: Pill  ?Other Topics Concern  ? Not on file  ?Social History Narrative  ? Not on file  ? ?Social Determinants of Health  ? ?Financial Resource Strain: Not on file  ?Food Insecurity: Not on  file  ?Transportation Needs: Not on file  ?Physical Activity: Not on file  ?Stress: Not on file  ?Social Connections: Not on file  ?Intimate Partner Violence: Not on file  ? ? ? ?Review of Systems  ? ?Gen: Denies any fever, chills, fatigue, weight loss, lack of appetite.  ?CV: Denies chest pain, heart palpitations, peripheral edema, syncope.  ?Resp: Denies shortness of breath at rest or with exertion. Denies wheezing or cough.  ?GI: see HPI ?GU : Denies urinary burning, urinary frequency, urinary hesitancy ?MS: Denies joint pain, muscle weakness, cramps, or limitation of movement.  ?Derm: Denies rash, itching, dry skin ?Psych: Denies depression, anxiety, memory loss, and confusion ?Heme: Denies bruising, bleeding,  and enlarged lymph nodes. ? ? ?Physical Exam  ? ?BP (!) 142/98 (BP Location: Right Arm, Patient Position: Sitting, Cuff Size: Normal)   Pulse 65   Temp 97.8 ?F (36.6 ?C) (Temporal)   Ht 5\' 3"  (1.6 m)   Wt 173 lb 9.6 oz (78.7 kg)   LMP 08/13/2021 (Approximate)   SpO2 99%   BMI 30.75 kg/m?  ?General:   Alert and oriented. Pleasant and cooperative. Well-nourished and well-developed.  ?Head:  Normocephalic and atraumatic. ?Eyes:  Without icterus ?Abdomen:  +BS, soft, non-tender and non-distended. No HSM noted. No guarding or rebound. No masses appreciated.  ?Rectal:  discomfort with DRE. No obvious fissure. No banding today.  ?Msk:  Symmetrical without gross deformities. Normal posture. ?Extremities:  Without edema. ?Neurologic:  Alert and  oriented x4;  grossly normal neurologically. ?Skin:  Intact without significant lesions or rashes. ?Psych:  Alert and cooperative. Normal mood and affect. ? ? ?Assessment  ? ?Susan Valdez is a 36 y.o. female presenting today in follow-up with a history of  symptomatic hemorrhoids, previously seen in June 2022 with colonoscopy recommended in light of rectal bleeding. She did not complete this but returns today with persistent symptoms.  ? ?Query both internal hemorrhoids and concomitant anal fissure at this time. Will treat with [facility] cream compounded with Nitro. I hope once this is healed, we can attempt banding. ? ?In interim, needs diagnostic colonoscopy due to history of rectal bleeding.  ? ? ? ?PLAN  ? ? ?Proceed with colonoscopy by Dr. Abbey Chatters  in near future: the risks, benefits, and alternatives have been discussed with the patient in detail. The patient states understanding and desires to proceed.  ?[facility] hemorrhoid cream, compounded with nitro.  ?Follow-up after colonoscopy for potential banding.  ? ? ?Annitta Needs, PhD, ANP-BC ?[facility]  ? ? ?

## 2021-08-24 NOTE — Progress Notes (Signed)
External hemorrhoids

## 2021-08-24 NOTE — H&P (View-Only) (Signed)
? ? ?Gastroenterology Office Note   ? ? ?Primary Care Physician:  Patel, Rutwik K, MD  ?Primary Gastroenterologist: Dr. Carver ? ? ?Chief Complaint  ? ?Chief Complaint  ?Patient presents with  ? Hemorrhoids  ? ? ? ?History of Present Illness  ? ?Susan Valdez is a 35 y.o. female presenting today in follow-up with a history of symptomatic hemorrhoids, previously seen in June 2022 with colonoscopy recommended in light of rectal bleeding. She did not complete this but returns today with persistent symptoms.  ? ?Notes persistent issues with hemorrhoids. Sometimes a rectal cramp. Itchy. Prolapsing tissue. Not as much blood any longer. No straining with BMs. 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 Sometimes discomfort with BMS.  ? ? ?Past Medical History:  ?Diagnosis Date  ? Chlamydia   ? Hypertension   ? Increased serum lipids   ? Pre-diabetes   ? Toxemia in pregnancy   ? resolved  ? ? ?Past Surgical History:  ?Procedure Laterality Date  ? CESAREAN SECTION    ? x1  ? FIBULA FRACTURE SURGERY Right   ? LAPAROSCOPIC BILATERAL SALPINGECTOMY Bilateral 04/12/2016  ? Procedure: LAPAROSCOPIC BILATERAL SALPINGECTOMY;  Surgeon: Luther H Eure, MD;  Location: [facility];  Service: Gynecology;  Laterality: Bilateral;  ? LYSIS OF ADHESION N/A 04/12/2016  ? Procedure: LYSIS OF ADHESION;  Surgeon: Luther H Eure, MD;  Location: [facility];  Service: Gynecology;  Laterality: N/A;  ? ? ?Current Outpatient Medications  ?Medication Sig Dispense Refill  ? DULoxetine (CYMBALTA) 30 MG capsule Take 1 capsule (30 mg total) by mouth daily. 30 capsule 5  ? hydrocortisone (ANUSOL-HC) 2.5 % rectal cream Place 1 application rectally 2 (two) times daily. (Patient taking differently: Place 1 application. rectally 2 (two) times daily. As needed) 30 g 0  ? lisinopril (ZESTRIL) 10 MG tablet TAKE (1) TABLET BY MOUTH ONCE DAILY. 90 tablet 0  ? metFORMIN (GLUCOPHAGE) 500 MG tablet Take 1 tablet (500 mg total) by mouth daily with breakfast.  90 tablet 1  ? rosuvastatin (CRESTOR) 10 MG tablet Take 1 tablet (10 mg total) by mouth daily. 90 tablet 1  ? ?No current facility-administered medications for this visit.  ? ? ?Allergies as of 08/24/2021 - Review Complete 08/24/2021  ?Allergen Reaction Noted  ? Penicillins Hives 03/11/2012  ? ? ?Family History  ?Problem Relation Age of Onset  ? Hypertension Mother   ? Diabetes Mother   ? Gestational diabetes Other   ? Colon cancer Neg Hx   ? Colon polyps Neg Hx   ? ? ?Social History  ? ?Socioeconomic History  ? Marital status: Single  ?  Spouse name: Not on file  ? Number of children: Not on file  ? Years of education: Not on file  ? Highest education level: Not on file  ?Occupational History  ? Not on file  ?Tobacco Use  ? Smoking status: Former  ?  Types: Cigarettes, E-cigarettes  ? Smokeless tobacco: Never  ? Tobacco comments:  ?  Only vapes  ?Vaping Use  ? Vaping Use: Every day  ?Substance and Sexual Activity  ? Alcohol use: Yes  ?  Comment: occas  ? Drug use: No  ? Sexual activity: Yes  ?  Birth control/protection: Pill  ?Other Topics Concern  ? Not on file  ?Social History Narrative  ? Not on file  ? ?Social Determinants of Health  ? ?Financial Resource Strain: Not on file  ?Food Insecurity: Not on   file  ?Transportation Needs: Not on file  ?Physical Activity: Not on file  ?Stress: Not on file  ?Social Connections: Not on file  ?Intimate Partner Violence: Not on file  ? ? ? ?Review of Systems  ? ?Gen: Denies any fever, chills, fatigue, weight loss, lack of appetite.  ?CV: Denies chest pain, heart palpitations, peripheral edema, syncope.  ?Resp: Denies shortness of breath at rest or with exertion. Denies wheezing or cough.  ?GI: see HPI ?GU : Denies urinary burning, urinary frequency, urinary hesitancy ?MS: Denies joint pain, muscle weakness, cramps, or limitation of movement.  ?Derm: Denies rash, itching, dry skin ?Psych: Denies depression, anxiety, memory loss, and confusion ?Heme: Denies bruising, bleeding,  and enlarged lymph nodes. ? ? ?Physical Exam  ? ?BP (!) 142/98 (BP Location: Right Arm, Patient Position: Sitting, Cuff Size: Normal)   Pulse 65   Temp 97.8 ?F (36.6 ?C) (Temporal)   Ht 5' 3" (1.6 m)   Wt 173 lb 9.6 oz (78.7 kg)   LMP 08/13/2021 (Approximate)   SpO2 99%   BMI 30.75 kg/m?  ?General:   Alert and oriented. Pleasant and cooperative. Well-nourished and well-developed.  ?Head:  Normocephalic and atraumatic. ?Eyes:  Without icterus ?Abdomen:  +BS, soft, non-tender and non-distended. No HSM noted. No guarding or rebound. No masses appreciated.  ?Rectal:  discomfort with DRE. No obvious fissure. No banding today.  ?Msk:  Symmetrical without gross deformities. Normal posture. ?Extremities:  Without edema. ?Neurologic:  Alert and  oriented x4;  grossly normal neurologically. ?Skin:  Intact without significant lesions or rashes. ?Psych:  Alert and cooperative. Normal mood and affect. ? ? ?Assessment  ? ?Susan Valdez is a 35 y.o. female presenting today in follow-up with a history of  symptomatic hemorrhoids, previously seen in June 2022 with colonoscopy recommended in light of rectal bleeding. She did not complete this but returns today with persistent symptoms.  ? ?Query both internal hemorrhoids and concomitant anal fissure at this time. Will treat with [facility] cream compounded with Nitro. I hope once this is healed, we can attempt banding. ? ?In interim, needs diagnostic colonoscopy due to history of rectal bleeding.  ? ? ? ?PLAN  ? ? ?Proceed with colonoscopy by Dr. Carver  in near future: the risks, benefits, and alternatives have been discussed with the patient in detail. The patient states understanding and desires to proceed.  ?[facility] hemorrhoid cream, compounded with nitro.  ?Follow-up after colonoscopy for potential banding.  ? ? ?Lattie Cervi W. Dave Mannes, PhD, ANP-BC ?[facility]  ? ? ?

## 2021-08-29 DIAGNOSIS — Z419 Encounter for procedure for purposes other than remedying health state, unspecified: Secondary | ICD-10-CM | POA: Diagnosis not present

## 2021-09-01 ENCOUNTER — Telehealth: Payer: Self-pay | Admitting: *Deleted

## 2021-09-01 NOTE — Telephone Encounter (Signed)
Received fax from [facility], no PA required ?

## 2021-09-01 NOTE — Telephone Encounter (Signed)
PA submitted via [facility]. Pending review Reference Number: [id number]  ?

## 2021-09-13 ENCOUNTER — Other Ambulatory Visit (HOSPITAL_COMMUNITY)
Admission: RE | Admit: 2021-09-13 | Discharge: 2021-09-13 | Disposition: A | Discharge status: Home / Self Care | Payer: Medicaid Other | Source: Ambulatory Visit | Attending: Internal Medicine | Admitting: Internal Medicine

## 2021-09-13 DIAGNOSIS — K6289 Other specified diseases of anus and rectum: Secondary | ICD-10-CM | POA: Diagnosis not present

## 2021-09-13 DIAGNOSIS — K641 Second degree hemorrhoids: Secondary | ICD-10-CM | POA: Diagnosis not present

## 2021-09-13 LAB — PREGNANCY, URINE: Preg Test, Ur: NEGATIVE

## 2021-09-15 ENCOUNTER — Telehealth: Payer: Self-pay | Admitting: *Deleted

## 2021-09-15 NOTE — Telephone Encounter (Signed)
Received call from pt. Pharmacy gave her trylite instead of clenpiq. She called the pharmacy and they are going to deliver the correct prep kit to her. Nothing further needed

## 2021-09-16 ENCOUNTER — Encounter (HOSPITAL_COMMUNITY)
Admission: RE | Disposition: A | Discharge status: Home / Self Care | Payer: Self-pay | Source: Home / Self Care | Attending: Internal Medicine

## 2021-09-16 ENCOUNTER — Ambulatory Visit (HOSPITAL_COMMUNITY)
Admission: RE | Admit: 2021-09-16 | Discharge: 2021-09-16 | Disposition: A | Discharge status: Home / Self Care | Payer: Medicaid Other | Attending: Internal Medicine | Admitting: Internal Medicine

## 2021-09-16 ENCOUNTER — Other Ambulatory Visit: Payer: Self-pay

## 2021-09-16 ENCOUNTER — Ambulatory Visit (HOSPITAL_BASED_OUTPATIENT_CLINIC_OR_DEPARTMENT_OTHER): Payer: Medicaid Other | Admitting: Anesthesiology

## 2021-09-16 ENCOUNTER — Encounter (HOSPITAL_COMMUNITY): Payer: Self-pay

## 2021-09-16 ENCOUNTER — Ambulatory Visit (HOSPITAL_COMMUNITY): Payer: Medicaid Other | Admitting: Anesthesiology

## 2021-09-16 DIAGNOSIS — K649 Unspecified hemorrhoids: Secondary | ICD-10-CM

## 2021-09-16 DIAGNOSIS — I10 Essential (primary) hypertension: Secondary | ICD-10-CM | POA: Diagnosis not present

## 2021-09-16 DIAGNOSIS — K625 Hemorrhage of anus and rectum: Secondary | ICD-10-CM

## 2021-09-16 DIAGNOSIS — Z720 Tobacco use: Secondary | ICD-10-CM

## 2021-09-16 DIAGNOSIS — E119 Type 2 diabetes mellitus without complications: Secondary | ICD-10-CM | POA: Diagnosis not present

## 2021-09-16 DIAGNOSIS — F419 Anxiety disorder, unspecified: Secondary | ICD-10-CM | POA: Insufficient documentation

## 2021-09-16 DIAGNOSIS — K648 Other hemorrhoids: Secondary | ICD-10-CM | POA: Diagnosis not present

## 2021-09-16 DIAGNOSIS — Z7984 Long term (current) use of oral hypoglycemic drugs: Secondary | ICD-10-CM | POA: Diagnosis not present

## 2021-09-16 DIAGNOSIS — R7401 Elevation of levels of liver transaminase levels: Secondary | ICD-10-CM

## 2021-09-16 DIAGNOSIS — Z0001 Encounter for general adult medical examination with abnormal findings: Secondary | ICD-10-CM

## 2021-09-16 DIAGNOSIS — R0683 Snoring: Secondary | ICD-10-CM

## 2021-09-16 DIAGNOSIS — E782 Mixed hyperlipidemia: Secondary | ICD-10-CM

## 2021-09-16 DIAGNOSIS — K644 Residual hemorrhoidal skin tags: Secondary | ICD-10-CM | POA: Diagnosis not present

## 2021-09-16 DIAGNOSIS — R7303 Prediabetes: Secondary | ICD-10-CM

## 2021-09-16 DIAGNOSIS — K6289 Other specified diseases of anus and rectum: Secondary | ICD-10-CM

## 2021-09-16 DIAGNOSIS — F17290 Nicotine dependence, other tobacco product, uncomplicated: Secondary | ICD-10-CM | POA: Insufficient documentation

## 2021-09-16 DIAGNOSIS — R5383 Other fatigue: Secondary | ICD-10-CM

## 2021-09-16 HISTORY — PX: COLONOSCOPY WITH PROPOFOL: SHX5780

## 2021-09-16 LAB — GLUCOSE, CAPILLARY: Glucose-Capillary: 141 mg/dL — ABNORMAL HIGH (ref 70–99)

## 2021-09-16 SURGERY — COLONOSCOPY WITH PROPOFOL
Anesthesia: General

## 2021-09-16 MED ORDER — PROPOFOL 10 MG/ML IV BOLUS
INTRAVENOUS | Status: DC | PRN
  Administered 2021-09-16: 100 mg via INTRAVENOUS

## 2021-09-16 MED ORDER — LACTATED RINGERS IV SOLN: INTRAVENOUS | Status: DC

## 2021-09-16 MED ORDER — PROPOFOL 500 MG/50ML IV EMUL
INTRAVENOUS | Status: DC | PRN
  Administered 2021-09-16: 200 ug/kg/min via INTRAVENOUS

## 2021-09-16 MED ORDER — LIDOCAINE 2% (20 MG/ML) 5 ML SYRINGE
INTRAMUSCULAR | Status: DC | PRN
  Administered 2021-09-16: 50 mg via INTRAVENOUS

## 2021-09-16 NOTE — Progress Notes (Signed)
Please excuse [name] from school on Friday Sep 16, 2021. She has been caring for her mother Susan Valdez who cannot drive, operate machinery , or sign legal documents for 24 hours.

## 2021-09-16 NOTE — Op Note (Signed)
[facility] Patient Name: Susan Valdez Procedure Date: 09/16/2021 9:27 AM MRN: [id number] Date of Birth: 1986/02/13 Attending MD: Elon Alas. Abbey Chatters DO CSN: [id number] Age: 36 Admit Type: Outpatient Procedure:                Colonoscopy Indications:              Rectal bleeding Providers:                Elon Alas. Abbey Chatters, DO, Tammy Vaught, RN, [place]                            Theda Sers RN, RN Referring MD:              Medicines:                See the Anesthesia note for documentation of the                            administered medications Complications:            No immediate complications. Estimated Blood Loss:     Estimated blood loss: none. Procedure:                Pre-Anesthesia Assessment:                           - The anesthesia plan was to use monitored                            anesthesia care (MAC).                           After obtaining informed consent, the colonoscope                            was passed under direct vision. Throughout the                            procedure, the patient's blood pressure, pulse, and                            oxygen saturations were monitored continuously. The                            [id number]-HQ190L ([id number]) scope was introduced through                            the anus and advanced to the the cecum, identified                            by appendiceal orifice and ileocecal valve. The                            colonoscopy was performed without difficulty. The                            patient tolerated the procedure well. The quality  of the bowel preparation was evaluated using the                            BBPS [facility] Bowel Preparation Scale) with scores                            of: Right Colon = 3, Transverse Colon = 3 and Left                            Colon = 3 (entire mucosa seen well with no residual                            staining, small fragments of stool or opaque                             liquid). The total BBPS score equals 9. Scope In: 9:35:26 AM Scope Out: 9:43:28 AM Scope Withdrawal Time: 0 hours 6 minutes 7 seconds  Total Procedure Duration: 0 hours 8 minutes 2 seconds  Findings:      Hemorrhoids were found on perianal exam.      Non-bleeding internal hemorrhoids were found during endoscopy.      The exam was otherwise without abnormality. Impression:               - Hemorrhoids found on perianal exam.                           - Non-bleeding internal hemorrhoids.                           - The examination was otherwise normal.                           - No specimens collected. Moderate Sedation:      Per Anesthesia Care Recommendation:           - Patient has a contact number available for                            emergencies. The signs and symptoms of potential                            delayed complications were discussed with the                            patient. Return to normal activities tomorrow.                            Written discharge instructions were provided to the                            patient.                           - Resume previous diet.                           -  Continue present medications.                           - Repeat colonoscopy at age 67 or sooner if higher                            risk for screening purposes.                           - Return to GI clinic at the next available                            appointment to discuss hemorrhoid banding Procedure Code(s):        --- Professional ---                           [id number], Colonoscopy, flexible; diagnostic, including                            collection of specimen(s) by brushing or washing,                            when performed (separate procedure) Diagnosis Code(s):        --- Professional ---                           K64.8, Other hemorrhoids                           K62.5, Hemorrhage of anus and rectum CPT copyright 2019  American Medical Association. All rights reserved. The codes documented in this report are preliminary and upon coder review may  be revised to meet current compliance requirements. Elon Alas. Abbey Chatters, DO [place] Abbey Chatters, DO 09/16/2021 10:14:30 AM This report has been signed electronically. Number of Addenda: 0

## 2021-09-16 NOTE — Interval H&P Note (Signed)
History and Physical Interval Note:  09/16/2021 9:01 AM  Susan Valdez  has presented today for surgery, with the diagnosis of hx rectal bleeding.  The various methods of treatment have been discussed with the patient and family. After consideration of risks, benefits and other options for treatment, the patient has consented to  Procedure(s) with comments: COLONOSCOPY WITH PROPOFOL (N/A) - 9:45am as a surgical intervention.  The patient's history has been reviewed, patient examined, no change in status, stable for surgery.  I have reviewed the patient's chart and labs.  Questions were answered to the patient's satisfaction.     Lanelle Bal

## 2021-09-16 NOTE — Transfer of Care (Signed)
Immediate Anesthesia Transfer of Care Note  Patient: Susan Valdez  Procedure(s) Performed: COLONOSCOPY WITH PROPOFOL  Patient Location: Endoscopy Unit  Anesthesia Type:MAC  Level of Consciousness: awake, alert , oriented and patient cooperative  Airway & Oxygen Therapy: Patient Spontanous Breathing  Post-op Assessment: Report given to RN, Post -op Vital signs reviewed and stable and Patient moving all extremities  Post vital signs: Reviewed and stable  Last Vitals:  Vitals Value Taken Time  BP    Temp    Pulse    Resp    SpO2      Last Pain:  Vitals:   09/16/21 0931  TempSrc:   PainSc: 0-No pain      Patients Stated Pain Goal: 5 (53/79/43 2761)  Complications: No notable events documented.

## 2021-09-16 NOTE — Anesthesia Preprocedure Evaluation (Signed)
Anesthesia Evaluation  Patient identified by MRN, date of birth, ID band Patient awake    Reviewed: Allergy & Precautions, NPO status , Patient's Chart, lab work & pertinent test results  Airway Mallampati: III  TM Distance: >3 FB Neck ROM: Full    Dental  (+) Dental Advisory Given, Teeth Intact   Pulmonary Current SmokerPatient did not abstain from smoking.,    Pulmonary exam normal breath sounds clear to auscultation       Cardiovascular hypertension, negative cardio ROS Normal cardiovascular exam Rhythm:Regular Rate:Normal     Neuro/Psych PSYCHIATRIC DISORDERS Anxiety negative neurological ROS     GI/Hepatic negative GI ROS, Neg liver ROS,   Endo/Other  diabetes, Well Controlled, Type 2, Oral Hypoglycemic Agents  Renal/GU negative Renal ROS  negative genitourinary   Musculoskeletal negative musculoskeletal ROS (+)   Abdominal   Peds negative pediatric ROS (+)  Hematology negative hematology ROS (+)   Anesthesia Other Findings   Reproductive/Obstetrics negative OB ROS                             Anesthesia Physical Anesthesia Plan  ASA: 2  Anesthesia Plan: General   Post-op Pain Management: Minimal or no pain anticipated   Induction: Intravenous  PONV Risk Score and Plan: Propofol infusion  Airway Management Planned: Nasal Cannula and Natural Airway  Additional Equipment:   Intra-op Plan:   Post-operative Plan:   Informed Consent: I have reviewed the patients History and Physical, chart, labs and discussed the procedure including the risks, benefits and alternatives for the proposed anesthesia with the patient or authorized representative who has indicated his/her understanding and acceptance.     Dental advisory given  Plan Discussed with: CRNA and Surgeon  Anesthesia Plan Comments:         Anesthesia Quick Evaluation

## 2021-09-16 NOTE — Anesthesia Postprocedure Evaluation (Signed)
Anesthesia Post Note  Patient: Susan Valdez  Procedure(s) Performed: COLONOSCOPY WITH PROPOFOL  Patient location during evaluation: Endoscopy Anesthesia Type: General Level of consciousness: awake and alert and oriented Pain management: pain level controlled Vital Signs Assessment: post-procedure vital signs reviewed and stable Respiratory status: spontaneous breathing, nonlabored ventilation and respiratory function stable Cardiovascular status: blood pressure returned to baseline and stable Postop Assessment: no apparent nausea or vomiting Anesthetic complications: no   No notable events documented.   Last Vitals:  Vitals:   09/16/21 0847 09/16/21 0946  BP: (!) 123/95 (!) 96/49  Pulse: 84 78  Resp: 16 18  Temp: 36.5 C 36.8 C  SpO2: 98% 96%    Last Pain:  Vitals:   09/16/21 0946  TempSrc: Oral  PainSc: 0-No pain                 Holland Kotter C Sudiksha Victor

## 2021-09-16 NOTE — Discharge Instructions (Addendum)
  Colonoscopy Discharge Instructions  Read the instructions outlined below and refer to this sheet in the next few weeks. These discharge instructions provide you with general information on caring for yourself after you leave the hospital. Your doctor may also give you specific instructions. While your treatment has been planned according to the most current medical practices available, unavoidable complications occasionally occur.   ACTIVITY You may resume your regular activity, but move at a slower pace for the next 24 hours.  Take frequent rest periods for the next 24 hours.  Walking will help get rid of the air and reduce the bloated feeling in your belly (abdomen).  No driving for 24 hours (because of the medicine (anesthesia) used during the test).   Do not sign any important legal documents or operate any machinery for 24 hours (because of the anesthesia used during the test).  NUTRITION Drink plenty of fluids.  You may resume your normal diet as instructed by your doctor.  Begin with a light meal and progress to your normal diet. Heavy or fried foods are harder to digest and may make you feel sick to your stomach (nauseated).  Avoid alcoholic beverages for 24 hours or as instructed.  MEDICATIONS You may resume your normal medications unless your doctor tells you otherwise.  WHAT YOU CAN EXPECT TODAY Some feelings of bloating in the abdomen.  Passage of more gas than usual.  Spotting of blood in your stool or on the toilet paper.  IF YOU HAD POLYPS REMOVED DURING THE COLONOSCOPY: No aspirin products for 7 days or as instructed.  No alcohol for 7 days or as instructed.  Eat a soft diet for the next 24 hours.  FINDING OUT THE RESULTS OF YOUR TEST Not all test results are available during your visit. If your test results are not back during the visit, make an appointment with your caregiver to find out the results. Do not assume everything is normal if you have not heard from your  caregiver or the medical facility. It is important for you to follow up on all of your test results.  SEEK IMMEDIATE MEDICAL ATTENTION IF: You have more than a spotting of blood in your stool.  Your belly is swollen (abdominal distention).  You are nauseated or vomiting.  You have a temperature over 101.  You have abdominal pain or discomfort that is severe or gets worse throughout the day.   Your colonoscopy was relatively unremarkable.  I did not find any polyps or evidence of colon cancer.  I recommend repeating colonoscopy in at age 35 for colon cancer screening purposes.  You do have internal and external hemorrhoids. I would recommend increasing fiber in your diet or adding OTC Benefiber/Metamucil. Be sure to drink at least 4 to 6 glasses of water daily. Follow-up with Tobi Bastos to discuss hemorrhoid banding.    I hope you have a great rest of your week!  Hennie Duos. Marletta Lor, D.O. Gastroenterology and Hepatology [facility]

## 2021-09-22 ENCOUNTER — Encounter (HOSPITAL_COMMUNITY): Payer: Self-pay | Admitting: Internal Medicine

## 2021-09-29 DIAGNOSIS — Z419 Encounter for procedure for purposes other than remedying health state, unspecified: Secondary | ICD-10-CM | POA: Diagnosis not present

## 2021-10-06 ENCOUNTER — Encounter: Payer: Medicaid Other | Admitting: Gastroenterology

## 2021-10-14 ENCOUNTER — Other Ambulatory Visit: Payer: Self-pay | Admitting: *Deleted

## 2021-10-14 ENCOUNTER — Telehealth: Payer: Self-pay | Admitting: Internal Medicine

## 2021-10-14 ENCOUNTER — Other Ambulatory Visit: Payer: Self-pay | Admitting: Internal Medicine

## 2021-10-14 DIAGNOSIS — I10 Essential (primary) hypertension: Secondary | ICD-10-CM

## 2021-10-14 MED ORDER — LISINOPRIL 10 MG PO TABS: ORAL_TABLET | ORAL | 0 refills | Status: DC

## 2021-10-14 NOTE — Telephone Encounter (Signed)
Pt called wanting to know if she can get a refill on lisinopril 10mg ? States she has called phar several times and keeps getting hung up on    [facility]

## 2021-10-14 NOTE — Telephone Encounter (Signed)
Pt medication sent to pharmacy  

## 2021-10-29 DIAGNOSIS — Z419 Encounter for procedure for purposes other than remedying health state, unspecified: Secondary | ICD-10-CM | POA: Diagnosis not present

## 2021-11-22 ENCOUNTER — Ambulatory Visit (INDEPENDENT_AMBULATORY_CARE_PROVIDER_SITE_OTHER): Payer: Medicaid Other | Admitting: Internal Medicine

## 2021-11-22 ENCOUNTER — Encounter: Payer: Self-pay | Admitting: Internal Medicine

## 2021-11-22 VITALS — BP 124/86 | HR 107 | Resp 16 | Ht 62.0 in | Wt 175.4 lb

## 2021-11-22 DIAGNOSIS — F419 Anxiety disorder, unspecified: Secondary | ICD-10-CM | POA: Diagnosis not present

## 2021-11-22 DIAGNOSIS — I10 Essential (primary) hypertension: Secondary | ICD-10-CM | POA: Diagnosis not present

## 2021-11-22 DIAGNOSIS — E1169 Type 2 diabetes mellitus with other specified complication: Secondary | ICD-10-CM | POA: Diagnosis not present

## 2021-11-22 DIAGNOSIS — E782 Mixed hyperlipidemia: Secondary | ICD-10-CM | POA: Diagnosis not present

## 2021-11-22 DIAGNOSIS — R5382 Chronic fatigue, unspecified: Secondary | ICD-10-CM | POA: Diagnosis not present

## 2021-11-22 DIAGNOSIS — E559 Vitamin D deficiency, unspecified: Secondary | ICD-10-CM

## 2021-11-22 LAB — POCT GLYCOSYLATED HEMOGLOBIN (HGB A1C)
HbA1c POC (<> result, manual entry): 7.3 % (ref 4.0–5.6)
HbA1c, POC (controlled diabetic range): 7.3 % — AB (ref 0.0–7.0)

## 2021-11-22 MED ORDER — GLIPIZIDE ER 5 MG PO TB24: 5.0000 mg | ORAL_TABLET | Freq: Every day | ORAL | 1 refills | Status: DC

## 2021-11-22 NOTE — Assessment & Plan Note (Signed)
Chronic fatigue, could be multifactorial Had COVID infection, has had fatigue since then Had been changing jobs recently, which has been stressful for her Also reports mild anhedonia and anger bursts at times On Cymbalta, advised to take it regularly

## 2021-11-22 NOTE — Assessment & Plan Note (Signed)
Some of her symptoms are likely related to anxiety Needs to start taking Cymbalta considering her chronic fatigue

## 2021-11-22 NOTE — Assessment & Plan Note (Signed)
BP Readings from Last 1 Encounters:  11/22/21 124/86   Well-controlled with Lisinopril Counseled for compliance with the medications Advised DASH diet and moderate exercise/walking, at least 150 mins/week

## 2021-11-22 NOTE — Progress Notes (Signed)
Established Patient Office Visit  Subjective:  Patient ID: Susan Valdez, female    DOB: 1985-09-08  Age: 36 y.o. MRN: [id number]  CC:  Chief Complaint  Patient presents with   Follow-up    Follow up bp is better     HPI Susan Valdez is a 36 y.o. female with past medical history of HTN, type 2 DM and HLD who presents for f/u of her chronic medical conditions.  Type II DM with HLD: She had diarrhea and abdominal pain with metformin.  She has stopped taking metformin due to GI side effects. Her HbA1C was 7.3 today, higher than prior. She has been taking Crestor for HLD.  HTN: BP is well-controlled. Takes medications regularly. Patient denies headache, dizziness, chest pain, dyspnea or palpitations.  She continues to have mild fatigue. Of note, she has not been taking Cymbalta regularly. She has been stressed at her workplace as well. She has had anhedonia and anger bursts at times. Denies any SI or HI.  Past Medical History:  Diagnosis Date   Chlamydia    Hypertension    Increased serum lipids    Pre-diabetes    Toxemia in pregnancy    resolved    Past Surgical History:  Procedure Laterality Date   CESAREAN SECTION     x1   COLONOSCOPY WITH PROPOFOL N/A 09/16/2021   Procedure: COLONOSCOPY WITH PROPOFOL;  Surgeon: Eloise Harman, DO;  [facility]: [facility];  Service: Endoscopy;  Laterality: N/A;  9:45am   FIBULA FRACTURE SURGERY Right    LAPAROSCOPIC BILATERAL SALPINGECTOMY Bilateral 04/12/2016   Procedure: LAPAROSCOPIC BILATERAL SALPINGECTOMY;  Surgeon: Florian Buff, MD;  Location: [facility];  Service: Gynecology;  Laterality: Bilateral;   LYSIS OF ADHESION N/A 04/12/2016   Procedure: LYSIS OF ADHESION;  Surgeon: Florian Buff, MD;  Location: [facility];  Service: Gynecology;  Laterality: N/A;    Family History  Problem Relation Age of Onset   Hypertension Mother    Diabetes Mother    Gestational diabetes Other    Colon cancer Neg Hx    Colon polyps Neg Hx      Social History   Socioeconomic History   Marital status: Single    Spouse name: Not on file   Number of children: Not on file   Years of education: Not on file   Highest education level: Not on file  Occupational History   Not on file  Tobacco Use   Smoking status: Former    Types: Cigarettes, E-cigarettes   Smokeless tobacco: Never   Tobacco comments:    Only vapes  Vaping Use   Vaping Use: Every day  Substance and Sexual Activity   Alcohol use: Yes    Comment: occas   Drug use: No   Sexual activity: Yes    Birth control/protection: Pill  Other Topics Concern   Not on file  Social History Narrative   Not on file   Social Determinants of Health   Financial Resource Strain: Low Risk  (03/08/2020)   Overall Financial Resource Strain (CARDIA)    Difficulty of Paying Living Expenses: Not hard at all  Food Insecurity: No Food Insecurity (03/08/2020)   Hunger Vital Sign    Worried About Running Out of Food in the Last Year: Never true    Ran Out of Food in the Last Year: Never true  Transportation Needs: No Transportation Needs (03/08/2020)   PRAPARE - Hydrologist (Medical): No  Lack of Transportation (Non-Medical): No  Physical Activity: Insufficiently Active (03/08/2020)   Exercise Vital Sign    Days of Exercise per Week: 2 days    Minutes of Exercise per Session: 30 min  Stress: No Stress Concern Present (03/08/2020)   [place]    Feeling of Stress : Not at all  Social Connections: Moderately Isolated (03/08/2020)   Social Connection and Isolation Panel [NHANES]    Frequency of Communication with Friends and Family: More than three times a week    Frequency of Social Gatherings with Friends and Family: Once a week    Attends Religious Services: More than 4 times per year    Active Member of [employer] or Organizations: No    Attends Archivist Meetings: Never     Marital Status: Never married  Intimate Partner Violence: Not At Risk (03/08/2020)   Humiliation, Afraid, Rape, and Kick questionnaire    Fear of Current or Ex-Partner: No    Emotionally Abused: No    Physically Abused: No    Sexually Abused: No    Outpatient Medications Prior to Visit  Medication Sig Dispense Refill   DULoxetine (CYMBALTA) 30 MG capsule Take 1 capsule (30 mg total) by mouth daily. 30 capsule 5   hydrocortisone (ANUSOL-HC) 2.5 % rectal cream Place 1 application rectally 2 (two) times daily. (Patient taking differently: Place 1 application  rectally 2 (two) times daily as needed for hemorrhoids or anal itching.) 30 g 0   lisinopril (ZESTRIL) 10 MG tablet TAKE (1) TABLET BY MOUTH ONCE DAILY. 90 tablet 0   rosuvastatin (CRESTOR) 10 MG tablet Take 1 tablet (10 mg total) by mouth daily. 90 tablet 1   metFORMIN (GLUCOPHAGE) 500 MG tablet Take 1 tablet (500 mg total) by mouth daily with breakfast. 90 tablet 1   No facility-administered medications prior to visit.    Allergies  Allergen Reactions   Penicillins Hives    Has patient had a PCN reaction causing immediate rash, facial/tongue/throat swelling, SOB or lightheadedness with hypotension:unsure Has patient had a PCN reaction causing severe rash involving mucus membranes or skin necrosis:unsure Has patient had a PCN reaction that required hospitalization:No Has patient had a PCN reaction occurring within the last 10 years:No If all of the above answers are "NO", then may proceed with Cephalosporin use. Childhood reaction    ROS Review of Systems  Constitutional:  Positive for fatigue. Negative for chills and fever.  HENT:  Negative for congestion, sinus pressure, sinus pain and sore throat.   Eyes:  Negative for pain and discharge.  Respiratory:  Negative for cough and shortness of breath.   Cardiovascular:  Negative for chest pain and palpitations.  Gastrointestinal:  Negative for abdominal pain, nausea and  vomiting.  Endocrine: Negative for polydipsia and polyuria.  Genitourinary:  Negative for dysuria and hematuria.  Musculoskeletal:  Negative for neck pain and neck stiffness.  Skin:  Negative for rash.  Neurological:  Negative for dizziness, syncope, weakness and headaches.  Psychiatric/Behavioral:  Positive for dysphoric mood. Negative for agitation and behavioral problems. The patient is nervous/anxious.       Objective:    Physical Exam Vitals reviewed.  Constitutional:      General: She is not in acute distress.    Appearance: She is obese. She is not diaphoretic.  HENT:     Head: Normocephalic and atraumatic.     Nose: Nose normal.     Mouth/Throat:  Mouth: Mucous membranes are moist.  Eyes:     General: No scleral icterus.    Extraocular Movements: Extraocular movements intact.  Cardiovascular:     Rate and Rhythm: Normal rate and regular rhythm.     Pulses: Normal pulses.     Heart sounds: Normal heart sounds. No murmur heard. Pulmonary:     Breath sounds: Normal breath sounds. No wheezing or rales.  Musculoskeletal:     Cervical back: Neck supple. No tenderness.     Right lower leg: No edema.     Left lower leg: No edema.  Skin:    General: Skin is warm.     Findings: No rash.  Neurological:     General: No focal deficit present.     Mental Status: She is alert and oriented to person, place, and time.     Cranial Nerves: No cranial nerve deficit.     Sensory: No sensory deficit.     Motor: No weakness.  Psychiatric:        Mood and Affect: Mood normal.        Behavior: Behavior normal.     BP 124/86 (BP Location: Left Arm, Patient Position: Sitting, Cuff Size: Normal)   Pulse (!) 107   Resp 16   Ht 5' 2"  (1.575 m)   Wt 175 lb 6.4 oz (79.6 kg)   SpO2 95%   BMI 32.08 kg/m  Wt Readings from Last 3 Encounters:  11/22/21 175 lb 6.4 oz (79.6 kg)  09/16/21 176 lb (79.8 kg)  08/24/21 173 lb 9.6 oz (78.7 kg)    Lab Results  Component Value Date    TSH 0.902 01/27/2021   Lab Results  Component Value Date   WBC 6.5 01/27/2021   HGB 12.8 01/27/2021   HCT 37.4 01/27/2021   MCV 91 01/27/2021   PLT 327 01/27/2021   Lab Results  Component Value Date   NA 139 08/15/2021   K 4.8 08/15/2021   CO2 23 08/15/2021   GLUCOSE 146 (H) 08/15/2021   BUN 7 08/15/2021   CREATININE 0.57 08/15/2021   BILITOT <0.2 08/15/2021   ALKPHOS 60 08/15/2021   AST 19 08/15/2021   ALT 22 08/15/2021   PROT 6.1 08/15/2021   ALBUMIN 4.2 08/15/2021   CALCIUM 9.7 08/15/2021   ANIONGAP 9 04/05/2016   EGFR 122 08/15/2021   Lab Results  Component Value Date   CHOL 248 (H) 08/15/2021   Lab Results  Component Value Date   HDL 32 (L) 08/15/2021   Lab Results  Component Value Date   LDLCALC 153 (H) 08/15/2021   Lab Results  Component Value Date   TRIG 336 (H) 08/15/2021   Lab Results  Component Value Date   CHOLHDL 7.8 (H) 08/15/2021   Lab Results  Component Value Date   HGBA1C 7.3 11/22/2021   HGBA1C 7.3 (A) 11/22/2021      Assessment & Plan:   Problem List Items Addressed This Visit       Cardiovascular and Mediastinum   Hypertension    BP Readings from Last 1 Encounters:  11/22/21 124/86  Well-controlled with Lisinopril Counseled for compliance with the medications Advised DASH diet and moderate exercise/walking, at least 150 mins/week      Relevant Orders   CBC with Differential/Platelet     Endocrine   Diabetes mellitus ([place]) - Primary    Lab Results  Component Value Date   HGBA1C 7.3 11/22/2021   HGBA1C 7.3 (A) 11/22/2021   Uncontrolled Had  started Metformin, but had diarrhea with it Switched to Glipizide Was planning to start GLP-1 agonist, but she states that she will lose her insurance coverage soon Advised to follow diabetic diet On ACEi and statin F/u CMP and lipid panel Diabetic eye exam: Advised to follow up with Ophthalmology for diabetic eye exam      Relevant Medications   glipiZIDE (GLUCOTROL XL) 5  MG 24 hr tablet   Other Relevant Orders   Microalbumin / creatinine urine ratio   Hemoglobin A1c   CMP14+EGFR   CBC with Differential/Platelet   POCT glycosylated hemoglobin (Hb A1C) (Completed)     Other   Mixed hyperlipidemia    On Crestor      Relevant Orders   Lipid panel   Fatigue    Chronic fatigue, could be multifactorial Had COVID infection, has had fatigue since then Had been changing jobs recently, which has been stressful for her Also reports mild anhedonia and anger bursts at times On Cymbalta, advised to take it regularly      Relevant Orders   TSH   Anxiety    Some of her symptoms are likely related to anxiety Needs to start taking Cymbalta considering her chronic fatigue      Other Visit Diagnoses     Vitamin D deficiency       Relevant Orders   VITAMIN D 25 Hydroxy (Vit-D Deficiency, Fractures)       Meds ordered this encounter  Medications   glipiZIDE (GLUCOTROL XL) 5 MG 24 hr tablet    Sig: Take 1 tablet (5 mg total) by mouth daily with breakfast.    Dispense:  90 tablet    Refill:  1    Follow-up: Return in about 4 months (around 03/25/2022) for Annual physical.    Lindell Spar, MD

## 2021-11-22 NOTE — Assessment & Plan Note (Signed)
Lab Results  Component Value Date   HGBA1C 7.3 11/22/2021   HGBA1C 7.3 (A) 11/22/2021    Uncontrolled Had started Metformin, but had diarrhea with it Switched to Glipizide Was planning to start GLP-1 agonist, but she states that she will lose her insurance coverage soon Advised to follow diabetic diet On ACEi and statin F/u CMP and lipid panel Diabetic eye exam: Advised to follow up with Ophthalmology for diabetic eye exam

## 2021-11-22 NOTE — Assessment & Plan Note (Signed)
On Crestor 

## 2021-11-22 NOTE — Patient Instructions (Signed)
Please start taking Glipizide as prescribed.  Please continue to take other medications as prescribed.  Please continue to follow low carb diet and perform moderate exercise/walking at least 150 mins/week.  Please get fasting blood tests done before the next visit.

## 2021-11-22 NOTE — Progress Notes (Signed)
Established Patient Office Visit  Subjective:  Patient ID: Susan Valdez, female    DOB: 09/04/1985  Age: 36 y.o. MRN: [id number]  CC:  Chief Complaint  Patient presents with   Follow-up    Follow up bp is better     HPI Susan Valdez is a 36 y.o. female with past medical history of HTN, type 2 DM and HLD who presents for f/u of her chronic medical conditions.    Past Medical History:  Diagnosis Date   Chlamydia    Hypertension    Increased serum lipids    Pre-diabetes    Toxemia in pregnancy    resolved    Past Surgical History:  Procedure Laterality Date   CESAREAN SECTION     x1   COLONOSCOPY WITH PROPOFOL N/A 09/16/2021   Procedure: COLONOSCOPY WITH PROPOFOL;  Surgeon: Eloise Harman, DO;  Location: [facility];  Service: Endoscopy;  Laterality: N/A;  9:45am   FIBULA FRACTURE SURGERY Right    LAPAROSCOPIC BILATERAL SALPINGECTOMY Bilateral 04/12/2016   Procedure: LAPAROSCOPIC BILATERAL SALPINGECTOMY;  Surgeon: Florian Buff, MD;  Location: [facility];  Service: Gynecology;  Laterality: Bilateral;   LYSIS OF ADHESION N/A 04/12/2016   Procedure: LYSIS OF ADHESION;  Surgeon: Florian Buff, MD;  Location: [facility];  Service: Gynecology;  Laterality: N/A;    Family History  Problem Relation Age of Onset   Hypertension Mother    Diabetes Mother    Gestational diabetes Other    Colon cancer Neg Hx    Colon polyps Neg Hx     Social History   Socioeconomic History   Marital status: Single    Spouse name: Not on file   Number of children: Not on file   Years of education: Not on file   Highest education level: Not on file  Occupational History   Not on file  Tobacco Use   Smoking status: Former    Types: Cigarettes, E-cigarettes   Smokeless tobacco: Never   Tobacco comments:    Only vapes  Vaping Use   Vaping Use: Every day  Substance and Sexual Activity   Alcohol use: Yes    Comment: occas   Drug use: No   Sexual activity: Yes    Birth  control/protection: Pill  Other Topics Concern   Not on file  Social History Narrative   Not on file   Social Determinants of Health   Financial Resource Strain: Low Risk  (03/08/2020)   Overall Financial Resource Strain (CARDIA)    Difficulty of Paying Living Expenses: Not hard at all  Food Insecurity: No Food Insecurity (03/08/2020)   Hunger Vital Sign    Worried About Running Out of Food in the Last Year: Never true    Ran Out of Food in the Last Year: Never true  Transportation Needs: No Transportation Needs (03/08/2020)   PRAPARE - Hydrologist (Medical): No    Lack of Transportation (Non-Medical): No  Physical Activity: Insufficiently Active (03/08/2020)   Exercise Vital Sign    Days of Exercise per Week: 2 days    Minutes of Exercise per Session: 30 min  Stress: No Stress Concern Present (03/08/2020)   [place]    Feeling of Stress : Not at all  Social Connections: Moderately Isolated (03/08/2020)   Social Connection and Isolation Panel [NHANES]    Frequency of Communication with Friends and Family: More than three  times a week    Frequency of Social Gatherings with Friends and Family: Once a week    Attends Religious Services: More than 4 times per year    Active Member of [employer] or Organizations: No    Attends Archivist Meetings: Never    Marital Status: Never married  Intimate Partner Violence: Not At Risk (03/08/2020)   Humiliation, Afraid, Rape, and Kick questionnaire    Fear of Current or Ex-Partner: No    Emotionally Abused: No    Physically Abused: No    Sexually Abused: No    Outpatient Medications Prior to Visit  Medication Sig Dispense Refill   DULoxetine (CYMBALTA) 30 MG capsule Take 1 capsule (30 mg total) by mouth daily. 30 capsule 5   hydrocortisone (ANUSOL-HC) 2.5 % rectal cream Place 1 application rectally 2 (two) times daily. (Patient taking  differently: Place 1 application  rectally 2 (two) times daily as needed for hemorrhoids or anal itching.) 30 g 0   lisinopril (ZESTRIL) 10 MG tablet TAKE (1) TABLET BY MOUTH ONCE DAILY. 90 tablet 0   metFORMIN (GLUCOPHAGE) 500 MG tablet Take 1 tablet (500 mg total) by mouth daily with breakfast. 90 tablet 1   rosuvastatin (CRESTOR) 10 MG tablet Take 1 tablet (10 mg total) by mouth daily. 90 tablet 1   No facility-administered medications prior to visit.    Allergies  Allergen Reactions   Penicillins Hives    Has patient had a PCN reaction causing immediate rash, facial/tongue/throat swelling, SOB or lightheadedness with hypotension:unsure Has patient had a PCN reaction causing severe rash involving mucus membranes or skin necrosis:unsure Has patient had a PCN reaction that required hospitalization:No Has patient had a PCN reaction occurring within the last 10 years:No If all of the above answers are "NO", then may proceed with Cephalosporin use. Childhood reaction    ROS Review of Systems    Objective:    Physical Exam  BP 124/86 (BP Location: Left Arm, Patient Position: Sitting, Cuff Size: Normal)   Pulse (!) 107   Resp 16   Ht 5' 2"  (1.575 m)   Wt 175 lb 6.4 oz (79.6 kg)   SpO2 95%   BMI 32.08 kg/m  Wt Readings from Last 3 Encounters:  11/22/21 175 lb 6.4 oz (79.6 kg)  09/16/21 176 lb (79.8 kg)  08/24/21 173 lb 9.6 oz (78.7 kg)    Lab Results  Component Value Date   TSH 0.902 01/27/2021   Lab Results  Component Value Date   WBC 6.5 01/27/2021   HGB 12.8 01/27/2021   HCT 37.4 01/27/2021   MCV 91 01/27/2021   PLT 327 01/27/2021   Lab Results  Component Value Date   NA 139 08/15/2021   K 4.8 08/15/2021   CO2 23 08/15/2021   GLUCOSE 146 (H) 08/15/2021   BUN 7 08/15/2021   CREATININE 0.57 08/15/2021   BILITOT <0.2 08/15/2021   ALKPHOS 60 08/15/2021   AST 19 08/15/2021   ALT 22 08/15/2021   PROT 6.1 08/15/2021   ALBUMIN 4.2 08/15/2021   CALCIUM 9.7  08/15/2021   ANIONGAP 9 04/05/2016   EGFR 122 08/15/2021   Lab Results  Component Value Date   CHOL 248 (H) 08/15/2021   Lab Results  Component Value Date   HDL 32 (L) 08/15/2021   Lab Results  Component Value Date   LDLCALC 153 (H) 08/15/2021   Lab Results  Component Value Date   TRIG 336 (H) 08/15/2021   Lab Results  Component Value Date   CHOLHDL 7.8 (H) 08/15/2021   Lab Results  Component Value Date   HGBA1C 7.0 (H) 08/15/2021      Assessment & Plan:   Problem List Items Addressed This Visit       Cardiovascular and Mediastinum   Hypertension     Endocrine   Diabetes mellitus ([place]) - Primary   Relevant Orders   Microalbumin / creatinine urine ratio    No orders of the defined types were placed in this encounter.   Follow-up: No follow-ups on file.    Lindell Spar, MD

## 2021-11-29 DIAGNOSIS — Z419 Encounter for procedure for purposes other than remedying health state, unspecified: Secondary | ICD-10-CM | POA: Diagnosis not present

## 2021-12-05 ENCOUNTER — Other Ambulatory Visit: Payer: Self-pay | Admitting: Internal Medicine

## 2021-12-05 DIAGNOSIS — E782 Mixed hyperlipidemia: Secondary | ICD-10-CM

## 2021-12-19 ENCOUNTER — Ambulatory Visit: Payer: Medicaid Other | Admitting: Internal Medicine

## 2022-02-08 ENCOUNTER — Other Ambulatory Visit: Payer: Self-pay | Admitting: *Deleted

## 2022-02-08 ENCOUNTER — Telehealth: Payer: Self-pay | Admitting: Internal Medicine

## 2022-02-08 DIAGNOSIS — I10 Essential (primary) hypertension: Secondary | ICD-10-CM

## 2022-02-08 MED ORDER — LISINOPRIL 10 MG PO TABS: ORAL_TABLET | ORAL | 0 refills | Status: DC

## 2022-02-08 NOTE — Telephone Encounter (Signed)
Patient needs refill on  ? ?lisinopril (ZESTRIL) 10 MG tablet  ?

## 2022-02-08 NOTE — Telephone Encounter (Signed)
Medication sent to pharmacy  

## 2022-02-10 ENCOUNTER — Telehealth: Payer: Self-pay | Admitting: Internal Medicine

## 2022-02-10 NOTE — Telephone Encounter (Signed)
Was sent on 10/11

## 2022-02-10 NOTE — Telephone Encounter (Signed)
Pt called stating she is needing a refill on lisinopril (ZESTRIL) 10 MG tablet. Can you please refill?    [facility]

## 2022-04-03 ENCOUNTER — Encounter: Payer: Self-pay | Admitting: Internal Medicine

## 2022-04-03 ENCOUNTER — Encounter: Payer: Medicaid Other | Admitting: Internal Medicine

## 2022-06-29 ENCOUNTER — Encounter: Payer: Self-pay | Admitting: Radiology

## 2022-07-14 ENCOUNTER — Ambulatory Visit (INDEPENDENT_AMBULATORY_CARE_PROVIDER_SITE_OTHER): Payer: Medicaid Other | Admitting: Internal Medicine

## 2022-07-14 ENCOUNTER — Encounter: Payer: Self-pay | Admitting: Internal Medicine

## 2022-07-14 VITALS — BP 148/96 | HR 82 | Ht 62.0 in | Wt 177.6 lb

## 2022-07-14 DIAGNOSIS — I10 Essential (primary) hypertension: Secondary | ICD-10-CM | POA: Diagnosis not present

## 2022-07-14 DIAGNOSIS — F419 Anxiety disorder, unspecified: Secondary | ICD-10-CM | POA: Diagnosis not present

## 2022-07-14 DIAGNOSIS — E559 Vitamin D deficiency, unspecified: Secondary | ICD-10-CM

## 2022-07-14 DIAGNOSIS — E1169 Type 2 diabetes mellitus with other specified complication: Secondary | ICD-10-CM | POA: Diagnosis not present

## 2022-07-14 DIAGNOSIS — R5382 Chronic fatigue, unspecified: Secondary | ICD-10-CM

## 2022-07-14 DIAGNOSIS — E782 Mixed hyperlipidemia: Secondary | ICD-10-CM | POA: Diagnosis not present

## 2022-07-14 MED ORDER — LANCETS MISC. MISC: 1.0000 | Freq: Three times a day (TID) | 0 refills | Status: AC

## 2022-07-14 MED ORDER — DULOXETINE HCL 30 MG PO CPEP: 30.0000 mg | ORAL_CAPSULE | Freq: Every day | ORAL | 1 refills | Status: DC

## 2022-07-14 MED ORDER — LISINOPRIL 10 MG PO TABS: ORAL_TABLET | ORAL | 0 refills | Status: DC

## 2022-07-14 MED ORDER — OZEMPIC (0.25 OR 0.5 MG/DOSE) 2 MG/3ML ~~LOC~~ SOPN: PEN_INJECTOR | SUBCUTANEOUS | 0 refills | Status: AC

## 2022-07-14 MED ORDER — ROSUVASTATIN CALCIUM 10 MG PO TABS: 10.0000 mg | ORAL_TABLET | Freq: Every day | ORAL | 1 refills | Status: DC

## 2022-07-14 MED ORDER — BLOOD GLUCOSE MONITORING SUPPL DEVI: 1.0000 | Freq: Three times a day (TID) | 0 refills | Status: AC

## 2022-07-14 MED ORDER — BLOOD GLUCOSE TEST VI STRP: 1.0000 | ORAL_STRIP | Freq: Three times a day (TID) | 0 refills | Status: AC

## 2022-07-14 MED ORDER — GLIPIZIDE ER 5 MG PO TB24: 5.0000 mg | ORAL_TABLET | Freq: Every day | ORAL | 1 refills | Status: DC

## 2022-07-14 NOTE — Assessment & Plan Note (Signed)
BP Readings from Last 1 Encounters:  07/14/22 (!) 148/96   Elevated today as she has not had her medicine today Usually well-controlled with Lisinopril Counseled for compliance with the medications Advised DASH diet and moderate exercise/walking, at least 150 mins/week

## 2022-07-14 NOTE — Assessment & Plan Note (Signed)
On Crestor, needs to start taking it again

## 2022-07-14 NOTE — Progress Notes (Signed)
Established Patient Office Visit  Subjective:  Patient ID: Susan Valdez, female    DOB: 29-Aug-1985  Age: 37 y.o. MRN: [id number]  CC:  Chief Complaint  Patient presents with   Hypertension    Follow up, patient states she is on blood pressure medication but due to stress she feels the medication is not working.    HPI Susan Valdez is a 37 y.o. female with past medical history of HTN, type 2 DM and HLD who presents for f/u of her chronic medical conditions.  Type II DM with HLD: She was placed on glipizide 5 mg QD, but has stopped taking it now due to lack of follow-up. She had diarrhea and abdominal pain with metformin. Her HbA1C was 7.3 in 07/23, higher than prior. She has stopped taking Crestor for HLD.  HTN: BP is elevated today, but she has not had her lisinopril today. Usually takes lisinopril regularly. Patient denies headache, dizziness, chest pain, dyspnea or palpitations.  She continues to have mild fatigue. Of note, she has not been taking Cymbalta regularly.  She has been stressed due to her mother's breast cancer chemotherapy.  She has been stressed at her workplace as well. She has had anhedonia and anger bursts at times. Denies any SI or HI.  Past Medical History:  Diagnosis Date   Chlamydia    Hypertension    Increased serum lipids    Pre-diabetes    Toxemia in pregnancy    resolved    Past Surgical History:  Procedure Laterality Date   CESAREAN SECTION     x1   COLONOSCOPY WITH PROPOFOL N/A 09/16/2021   Procedure: COLONOSCOPY WITH PROPOFOL;  Surgeon: Eloise Harman, DO;  Location: [facility];  Service: Endoscopy;  Laterality: N/A;  9:45am   FIBULA FRACTURE SURGERY Right    LAPAROSCOPIC BILATERAL SALPINGECTOMY Bilateral 04/12/2016   Procedure: LAPAROSCOPIC BILATERAL SALPINGECTOMY;  Surgeon: Florian Buff, MD;  Location: [facility];  Service: Gynecology;  Laterality: Bilateral;   LYSIS OF ADHESION N/A 04/12/2016   Procedure: LYSIS OF ADHESION;  Surgeon:  Florian Buff, MD;  Location: [facility];  Service: Gynecology;  Laterality: N/A;    Family History  Problem Relation Age of Onset   Hypertension Mother    Diabetes Mother    Gestational diabetes Other    Colon cancer Neg Hx    Colon polyps Neg Hx     Social History   Socioeconomic History   Marital status: Single    Spouse name: Not on file   Number of children: Not on file   Years of education: Not on file   Highest education level: Not on file  Occupational History   Not on file  Tobacco Use   Smoking status: Former    Types: Cigarettes, E-cigarettes   Smokeless tobacco: Never   Tobacco comments:    Only vapes  Vaping Use   Vaping Use: Every day  Substance and Sexual Activity   Alcohol use: Yes    Comment: occas   Drug use: No   Sexual activity: Yes    Birth control/protection: Pill  Other Topics Concern   Not on file  Social History Narrative   Not on file   Social Determinants of Health   Financial Resource Strain: Low Risk  (03/08/2020)   Overall Financial Resource Strain (CARDIA)    Difficulty of Paying Living Expenses: Not hard at all  Food Insecurity: No Food Insecurity (03/08/2020)   Hunger Vital Sign  Worried About Charity fundraiser in the Last Year: Never true    [place] in the Last Year: Never true  Transportation Needs: No Transportation Needs (03/08/2020)   PRAPARE - Hydrologist (Medical): No    Lack of Transportation (Non-Medical): No  Physical Activity: Insufficiently Active (03/08/2020)   Exercise Vital Sign    Days of Exercise per Week: 2 days    Minutes of Exercise per Session: 30 min  Stress: No Stress Concern Present (03/08/2020)   [place]    Feeling of Stress : Not at all  Social Connections: Moderately Isolated (03/08/2020)   Social Connection and Isolation Panel [NHANES]    Frequency of Communication with Friends and Family: More  than three times a week    Frequency of Social Gatherings with Friends and Family: Once a week    Attends Religious Services: More than 4 times per year    Active Member of [employer] or Organizations: No    Attends Archivist Meetings: Never    Marital Status: Never married  Intimate Partner Violence: Not At Risk (03/08/2020)   Humiliation, Afraid, Rape, and Kick questionnaire    Fear of Current or Ex-Partner: No    Emotionally Abused: No    Physically Abused: No    Sexually Abused: No    Outpatient Medications Prior to Visit  Medication Sig Dispense Refill   hydrocortisone (ANUSOL-HC) 2.5 % rectal cream Place 1 application rectally 2 (two) times daily. (Patient taking differently: Place 1 application  rectally 2 (two) times daily as needed for hemorrhoids or anal itching.) 30 g 0   DULoxetine (CYMBALTA) 30 MG capsule Take 1 capsule (30 mg total) by mouth daily. 30 capsule 5   glipiZIDE (GLUCOTROL XL) 5 MG 24 hr tablet Take 1 tablet (5 mg total) by mouth daily with breakfast. 90 tablet 1   lisinopril (ZESTRIL) 10 MG tablet TAKE (1) TABLET BY MOUTH ONCE DAILY. 90 tablet 0   rosuvastatin (CRESTOR) 10 MG tablet TAKE ONE TABLET BY MOUTH ONCE DAILY. 90 tablet 0   No facility-administered medications prior to visit.    Allergies  Allergen Reactions   Penicillins Hives    Has patient had a PCN reaction causing immediate rash, facial/tongue/throat swelling, SOB or lightheadedness with hypotension:unsure Has patient had a PCN reaction causing severe rash involving mucus membranes or skin necrosis:unsure Has patient had a PCN reaction that required hospitalization:No Has patient had a PCN reaction occurring within the last 10 years:No If all of the above answers are "NO", then may proceed with Cephalosporin use. Childhood reaction    ROS Review of Systems  Constitutional:  Positive for fatigue. Negative for chills and fever.  HENT:  Negative for congestion, sinus pressure, sinus  pain and sore throat.   Eyes:  Negative for pain and discharge.  Respiratory:  Negative for cough and shortness of breath.   Cardiovascular:  Negative for chest pain and palpitations.  Gastrointestinal:  Negative for abdominal pain, nausea and vomiting.  Endocrine: Negative for polydipsia and polyuria.  Genitourinary:  Negative for dysuria and hematuria.  Musculoskeletal:  Negative for neck pain and neck stiffness.  Skin:  Negative for rash.  Neurological:  Negative for dizziness, syncope, weakness and headaches.  Psychiatric/Behavioral:  Positive for dysphoric mood. Negative for agitation and behavioral problems. The patient is nervous/anxious.       Objective:    Physical Exam Vitals reviewed.  Constitutional:      General: She is not in acute distress.    Appearance: She is obese. She is not diaphoretic.  HENT:     Head: Normocephalic and atraumatic.     Nose: Nose normal.     Mouth/Throat:     Mouth: Mucous membranes are moist.  Eyes:     General: No scleral icterus.    Extraocular Movements: Extraocular movements intact.  Cardiovascular:     Rate and Rhythm: Normal rate and regular rhythm.     Pulses: Normal pulses.     Heart sounds: Normal heart sounds. No murmur heard. Pulmonary:     Breath sounds: Normal breath sounds. No wheezing or rales.  Musculoskeletal:     Cervical back: Neck supple. No tenderness.     Right lower leg: No edema.     Left lower leg: No edema.  Skin:    General: Skin is warm.     Findings: No rash.  Neurological:     General: No focal deficit present.     Mental Status: She is alert and oriented to person, place, and time.     Sensory: No sensory deficit.     Motor: No weakness.  Psychiatric:        Mood and Affect: Mood normal.        Behavior: Behavior normal.     BP (!) 148/96 (BP Location: Right Arm, Cuff Size: Normal)   Pulse 82   Ht 5\' 2"  (1.575 m)   Wt 177 lb 9.6 oz (80.6 kg)   SpO2 98%   BMI 32.48 kg/m  Wt Readings from  Last 3 Encounters:  07/14/22 177 lb 9.6 oz (80.6 kg)  11/22/21 175 lb 6.4 oz (79.6 kg)  09/16/21 176 lb (79.8 kg)    Lab Results  Component Value Date   TSH 0.902 01/27/2021   Lab Results  Component Value Date   WBC 6.5 01/27/2021   HGB 12.8 01/27/2021   HCT 37.4 01/27/2021   MCV 91 01/27/2021   PLT 327 01/27/2021   Lab Results  Component Value Date   NA 139 08/15/2021   K 4.8 08/15/2021   CO2 23 08/15/2021   GLUCOSE 146 (H) 08/15/2021   BUN 7 08/15/2021   CREATININE 0.57 08/15/2021   BILITOT <0.2 08/15/2021   ALKPHOS 60 08/15/2021   AST 19 08/15/2021   ALT 22 08/15/2021   PROT 6.1 08/15/2021   ALBUMIN 4.2 08/15/2021   CALCIUM 9.7 08/15/2021   ANIONGAP 9 04/05/2016   EGFR 122 08/15/2021   Lab Results  Component Value Date   CHOL 248 (H) 08/15/2021   Lab Results  Component Value Date   HDL 32 (L) 08/15/2021   Lab Results  Component Value Date   LDLCALC 153 (H) 08/15/2021   Lab Results  Component Value Date   TRIG 336 (H) 08/15/2021   Lab Results  Component Value Date   CHOLHDL 7.8 (H) 08/15/2021   Lab Results  Component Value Date   HGBA1C 7.3 11/22/2021   HGBA1C 7.3 (A) 11/22/2021      Assessment & Plan:   Problem List Items Addressed This Visit       Cardiovascular and Mediastinum   Hypertension    BP Readings from Last 1 Encounters:  07/14/22 (!) 148/96  Elevated today as she has not had her medicine today Usually well-controlled with Lisinopril Counseled for compliance with the medications Advised DASH diet and moderate exercise/walking, at least 150 mins/week  Relevant Medications   rosuvastatin (CRESTOR) 10 MG tablet   lisinopril (ZESTRIL) 10 MG tablet     Endocrine   Diabetes mellitus ([place]) - Primary    Lab Results  Component Value Date   HGBA1C 7.3 11/22/2021   HGBA1C 7.3 (A) 11/22/2021   Uncontrolled due to noncompliance Restart Glipizide 5 mg QD Added Ozempic, increase dose as tolerated Glucose meter and supplies  prescription sent today  Had started Metformin, but had diarrhea with it Advised to follow diabetic diet On ACEi and statin F/u CMP and lipid panel Diabetic eye exam: Advised to follow up with Ophthalmology for diabetic eye exam      Relevant Medications   Semaglutide,0.25 or 0.5MG /DOS, (OZEMPIC, 0.25 OR 0.5 MG/DOSE,) 2 MG/3ML SOPN   rosuvastatin (CRESTOR) 10 MG tablet   lisinopril (ZESTRIL) 10 MG tablet   glipiZIDE (GLUCOTROL XL) 5 MG 24 hr tablet   Blood Glucose Monitoring Suppl DEVI   Glucose Blood (BLOOD GLUCOSE TEST STRIPS) STRP   Lancets Misc. MISC   Other Relevant Orders   Hemoglobin A1c   CMP14+EGFR   CBC with Differential/Platelet   Urine Microalbumin w/creat. ratio     Other   Mixed hyperlipidemia    On Crestor, needs to start taking it again      Relevant Medications   rosuvastatin (CRESTOR) 10 MG tablet   lisinopril (ZESTRIL) 10 MG tablet   Other Relevant Orders   Lipid panel   Fatigue    Chronic fatigue, could be multifactorial Had COVID infection, has had fatigue since then Had been changing jobs recently, which has been stressful for her Also reports mild anhedonia and anger bursts at times On Cymbalta, advised to take it regularly      Relevant Medications   DULoxetine (CYMBALTA) 30 MG capsule   Other Relevant Orders   TSH   CMP14+EGFR   CBC with Differential/Platelet   Anxiety    Some of her symptoms are likely related to anxiety Needs to start taking Cymbalta considering her chronic fatigue      Relevant Medications   DULoxetine (CYMBALTA) 30 MG capsule   Other Relevant Orders   TSH   Other Visit Diagnoses     Vitamin D deficiency       Relevant Orders   VITAMIN D 25 Hydroxy (Vit-D Deficiency, Fractures)      Meds ordered this encounter  Medications   Semaglutide,0.25 or 0.5MG /DOS, (OZEMPIC, 0.25 OR 0.5 MG/DOSE,) 2 MG/3ML SOPN    Sig: Inject 0.25 mg into the skin every 7 (seven) days for 28 days, THEN 0.5 mg every 7 (seven) days.     Dispense:  3 mL    Refill:  0   rosuvastatin (CRESTOR) 10 MG tablet    Sig: Take 1 tablet (10 mg total) by mouth daily.    Dispense:  90 tablet    Refill:  1   lisinopril (ZESTRIL) 10 MG tablet    Sig: TAKE (1) TABLET BY MOUTH ONCE DAILY.    Dispense:  90 tablet    Refill:  0   DULoxetine (CYMBALTA) 30 MG capsule    Sig: Take 1 capsule (30 mg total) by mouth daily.    Dispense:  30 capsule    Refill:  1   glipiZIDE (GLUCOTROL XL) 5 MG 24 hr tablet    Sig: Take 1 tablet (5 mg total) by mouth daily with breakfast.    Dispense:  30 tablet    Refill:  1   Blood Glucose Monitoring  Suppl DEVI    Sig: 1 each by Does not apply route in the morning, at noon, and at bedtime. May substitute to any manufacturer covered by patient's insurance.    Dispense:  1 each    Refill:  0   Glucose Blood (BLOOD GLUCOSE TEST STRIPS) STRP    Sig: 1 each by In Vitro route in the morning, at noon, and at bedtime. May substitute to any manufacturer covered by patient's insurance.    Dispense:  100 strip    Refill:  0   Lancets Misc. MISC    Sig: 1 each by Does not apply route in the morning, at noon, and at bedtime. May substitute to any manufacturer covered by patient's insurance.    Dispense:  100 each    Refill:  0    Follow-up: Return in about 4 weeks (around 08/11/2022) for DM and HTN.    Lindell Spar, MD

## 2022-07-14 NOTE — Assessment & Plan Note (Signed)
Some of her symptoms are likely related to anxiety Needs to start taking Cymbalta considering her chronic fatigue

## 2022-07-14 NOTE — Assessment & Plan Note (Signed)
Chronic fatigue, could be multifactorial Had COVID infection, has had fatigue since then Had been changing jobs recently, which has been stressful for her Also reports mild anhedonia and anger bursts at times On Cymbalta, advised to take it regularly

## 2022-07-14 NOTE — Assessment & Plan Note (Signed)
Lab Results  Component Value Date   HGBA1C 7.3 11/22/2021   HGBA1C 7.3 (A) 11/22/2021    Uncontrolled due to noncompliance Restart Glipizide 5 mg QD Added Ozempic, increase dose as tolerated Glucose meter and supplies prescription sent today  Had started Metformin, but had diarrhea with it Advised to follow diabetic diet On ACEi and statin F/u CMP and lipid panel Diabetic eye exam: Advised to follow up with Ophthalmology for diabetic eye exam

## 2022-07-14 NOTE — Patient Instructions (Signed)
Please start taking Lisinopril 10 mg regularly for blood pressure.  Please start taking Glipizide and Ozempic regularly for diabetes.  Please start taking Rosuvastatin for cholesterol.  Please start taking Duloxetine for anxiety and fatigue.  Please follow low carb diet and perform moderate exercise/walking at least 150 mins/week.  Please check blood glucose regularly and bring the log in the next visit.

## 2022-07-15 LAB — CMP14+EGFR
ALT: 35 IU/L — ABNORMAL HIGH (ref 0–32)
AST: 37 IU/L (ref 0–40)
Albumin/Globulin Ratio: 1.9 (ref 1.2–2.2)
Albumin: 4.2 g/dL (ref 3.9–4.9)
Alkaline Phosphatase: 66 IU/L (ref 44–121)
BUN/Creatinine Ratio: 15 (ref 9–23)
BUN: 9 mg/dL (ref 6–20)
Bilirubin Total: <0.2 mg/dL (ref 0.0–1.2)
CO2: 21 mmol/L (ref 20–29)
Calcium: 9.2 mg/dL (ref 8.7–10.2)
Chloride: 104 mmol/L (ref 96–106)
Creatinine, Ser: 0.61 mg/dL (ref 0.57–1.00)
Globulin, Total: 2.2 g/dL (ref 1.5–4.5)
Glucose: 199 mg/dL — ABNORMAL HIGH (ref 70–99)
Potassium: 4.7 mmol/L (ref 3.5–5.2)
Sodium: 139 mmol/L (ref 134–144)
Total Protein: 6.4 g/dL (ref 6.0–8.5)
eGFR: 119 mL/min/{1.73_m2} (ref >59)

## 2022-07-15 LAB — CBC WITH DIFFERENTIAL/PLATELET
Basophils Absolute: 0 10*3/uL (ref 0.0–0.2)
Basos: 0 %
EOS (ABSOLUTE): 0.2 10*3/uL (ref 0.0–0.4)
Eos: 2 %
Hematocrit: 38.3 % (ref 34.0–46.6)
Hemoglobin: 12.8 g/dL (ref 11.1–15.9)
Immature Grans (Abs): 0 10*3/uL (ref 0.0–0.1)
Immature Granulocytes: 1 %
Lymphocytes Absolute: 3.2 10*3/uL — ABNORMAL HIGH (ref 0.7–3.1)
Lymphs: 41 %
MCH: 31.1 pg (ref 26.6–33.0)
MCHC: 33.4 g/dL (ref 31.5–35.7)
MCV: 93 fL (ref 79–97)
Monocytes Absolute: 0.4 10*3/uL (ref 0.1–0.9)
Monocytes: 6 %
Neutrophils Absolute: 3.9 10*3/uL (ref 1.4–7.0)
Neutrophils: 50 %
Platelets: 320 10*3/uL (ref 150–450)
RBC: 4.12 x10E6/uL (ref 3.77–5.28)
RDW: 12.1 % (ref 11.7–15.4)
WBC: 7.7 10*3/uL (ref 3.4–10.8)

## 2022-07-15 LAB — LIPID PANEL
Chol/HDL Ratio: 7.1 ratio — ABNORMAL HIGH (ref 0.0–4.4)
Cholesterol, Total: 234 mg/dL — ABNORMAL HIGH (ref 100–199)
HDL: 33 mg/dL — ABNORMAL LOW (ref >39)
LDL Chol Calc (NIH): 159 mg/dL — ABNORMAL HIGH (ref 0–99)
Triglycerides: 225 mg/dL — ABNORMAL HIGH (ref 0–149)
VLDL Cholesterol Cal: 42 mg/dL — ABNORMAL HIGH (ref 5–40)

## 2022-07-15 LAB — VITAMIN D 25 HYDROXY (VIT D DEFICIENCY, FRACTURES): Vit D, 25-Hydroxy: 25.4 ng/mL — ABNORMAL LOW (ref 30.0–100.0)

## 2022-07-15 LAB — TSH: TSH: 1.54 u[IU]/mL (ref 0.450–4.500)

## 2022-07-15 LAB — HEMOGLOBIN A1C
Est. average glucose Bld gHb Est-mCnc: 177 mg/dL
Hgb A1c MFr Bld: 7.8 % — ABNORMAL HIGH (ref 4.8–5.6)

## 2022-07-17 LAB — MICROALBUMIN / CREATININE URINE RATIO
Creatinine, Urine: 168.7 mg/dL
Microalb/Creat Ratio: 11 mg/g creat (ref 0–29)
Microalbumin, Urine: 19.4 ug/mL

## 2022-07-20 ENCOUNTER — Telehealth: Payer: Self-pay | Admitting: Internal Medicine

## 2022-07-20 NOTE — Telephone Encounter (Signed)
Faxed to [facility] 

## 2022-07-20 NOTE — Telephone Encounter (Signed)
Amy, [place] Clinical Pharmacist, [phone]   Fax# [phone]  Phar is needing pt labs (A1C, cholesterol).

## 2022-08-16 ENCOUNTER — Ambulatory Visit: Payer: Medicaid Other | Admitting: Internal Medicine

## 2022-09-05 ENCOUNTER — Encounter: Payer: Self-pay | Admitting: Internal Medicine

## 2022-09-05 ENCOUNTER — Ambulatory Visit (INDEPENDENT_AMBULATORY_CARE_PROVIDER_SITE_OTHER): Payer: Medicaid Other | Admitting: Internal Medicine

## 2022-09-05 VITALS — BP 124/84 | HR 84 | Ht 62.0 in | Wt 173.0 lb

## 2022-09-05 DIAGNOSIS — F4323 Adjustment disorder with mixed anxiety and depressed mood: Secondary | ICD-10-CM | POA: Insufficient documentation

## 2022-09-05 DIAGNOSIS — F419 Anxiety disorder, unspecified: Secondary | ICD-10-CM

## 2022-09-05 DIAGNOSIS — Z7984 Long term (current) use of oral hypoglycemic drugs: Secondary | ICD-10-CM

## 2022-09-05 DIAGNOSIS — E1169 Type 2 diabetes mellitus with other specified complication: Secondary | ICD-10-CM | POA: Diagnosis not present

## 2022-09-05 DIAGNOSIS — I10 Essential (primary) hypertension: Secondary | ICD-10-CM | POA: Diagnosis not present

## 2022-09-05 MED ORDER — HYDROXYZINE PAMOATE 25 MG PO CAPS: 25.0000 mg | ORAL_CAPSULE | Freq: Two times a day (BID) | ORAL | 0 refills | Status: AC | PRN

## 2022-09-05 NOTE — Assessment & Plan Note (Signed)
Lab Results  Component Value Date   HGBA1C 7.8 (H) 07/14/2022    Uncontrolled due to noncompliance Restart Glipizide 5 mg QD Added Ozempic, increase dose as tolerated - has not started it yet Glucose meter and supplies prescription sent  Had started Metformin, but had diarrhea with it Advised to follow diabetic diet On ACEi and statin F/u CMP and lipid panel Diabetic eye exam: Advised to follow up with Ophthalmology for diabetic eye exam

## 2022-09-05 NOTE — Progress Notes (Signed)
Established Patient Office Visit  Subjective:  Patient ID: Susan Valdez, female    DOB: 1985-08-15  Age: 37 y.o. MRN: [id number]  CC:  Chief Complaint  Patient presents with   Depression    Patient feels depressed due to the recent loss of her mother.    HPI VERONNICA Valdez is a 37 y.o. female with past medical history of HTN, type 2 DM and HLD who presents for f/u of her chronic medical conditions.  She continues to have mild fatigue. Of note, she has not been taking Cymbalta regularly.  She has been stressed due to loss of her mother about 15 days ago. She has been stressed at her workplace as well and could not work due to anxiety and stress. She has had anhedonia and anger bursts at times. Denies any SI or HI.  Type II DM with HLD: She was placed on glipizide 5 mg QD, but has stopped taking it now due to lack of follow-up and did not start taking it after her mother's death. She had diarrhea and abdominal pain with metformin. Her HbA1C was 7.8 in 03/24, higher than prior. She has stopped taking Crestor for HLD.  HTN: BP is wnl today. Takes lisinopril regularly. Patient denies headache, dizziness, chest pain, dyspnea or palpitations.   Past Medical History:  Diagnosis Date   Chlamydia    Hypertension    Increased serum lipids    Pre-diabetes    Toxemia in pregnancy    resolved    Past Surgical History:  Procedure Laterality Date   CESAREAN SECTION     x1   COLONOSCOPY WITH PROPOFOL N/A 09/16/2021   Procedure: COLONOSCOPY WITH PROPOFOL;  Surgeon: Lanelle Bal, DO;  Location: [facility];  Service: Endoscopy;  Laterality: N/A;  9:45am   FIBULA FRACTURE SURGERY Right    LAPAROSCOPIC BILATERAL SALPINGECTOMY Bilateral 04/12/2016   Procedure: LAPAROSCOPIC BILATERAL SALPINGECTOMY;  Surgeon: Lazaro Arms, MD;  Location: [facility];  Service: Gynecology;  Laterality: Bilateral;   LYSIS OF ADHESION N/A 04/12/2016   Procedure: LYSIS OF ADHESION;  Surgeon: Lazaro Arms, MD;   Location: [facility];  Service: Gynecology;  Laterality: N/A;    Family History  Problem Relation Age of Onset   Hypertension Mother    Diabetes Mother    Gestational diabetes Other    Colon cancer Neg Hx    Colon polyps Neg Hx     Social History   Socioeconomic History   Marital status: Single    Spouse name: Not on file   Number of children: Not on file   Years of education: Not on file   Highest education level: Not on file  Occupational History   Not on file  Tobacco Use   Smoking status: Former    Types: Cigarettes, E-cigarettes   Smokeless tobacco: Never   Tobacco comments:    Only vapes  Vaping Use   Vaping Use: Every day  Substance and Sexual Activity   Alcohol use: Yes    Comment: occas   Drug use: No   Sexual activity: Yes    Birth control/protection: Pill  Other Topics Concern   Not on file  Social History Narrative   Not on file   Social Determinants of Health   Financial Resource Strain: Low Risk  (03/08/2020)   Overall Financial Resource Strain (CARDIA)    Difficulty of Paying Living Expenses: Not hard at all  Food Insecurity: No Food Insecurity (03/08/2020)   Hunger  Vital Sign    Worried About Programme researcher, broadcasting/film/video in the Last Year: Never true    Ran Out of Food in the Last Year: Never true  Transportation Needs: No Transportation Needs (03/08/2020)   PRAPARE - Administrator, Civil Service (Medical): No    Lack of Transportation (Non-Medical): No  Physical Activity: Insufficiently Active (03/08/2020)   Exercise Vital Sign    Days of Exercise per Week: 2 days    Minutes of Exercise per Session: 30 min  Stress: No Stress Concern Present (03/08/2020)   [employer] of Occupational Health - Occupational Stress Questionnaire    Feeling of Stress : Not at all  Social Connections: Moderately Isolated (03/08/2020)   Social Connection and Isolation Panel [NHANES]    Frequency of Communication with Friends and Family: More than three times a  week    Frequency of Social Gatherings with Friends and Family: Once a week    Attends Religious Services: More than 4 times per year    Active Member of [employer] or Organizations: No    Attends Banker Meetings: Never    Marital Status: Never married  Intimate Partner Violence: Not At Risk (03/08/2020)   Humiliation, Afraid, Rape, and Kick questionnaire    Fear of Current or Ex-Partner: No    Emotionally Abused: No    Physically Abused: No    Sexually Abused: No    Outpatient Medications Prior to Visit  Medication Sig Dispense Refill   Blood Glucose Monitoring Suppl DEVI 1 each by Does not apply route in the morning, at noon, and at bedtime. May substitute to any manufacturer covered by patient's insurance. 1 each 0   DULoxetine (CYMBALTA) 30 MG capsule Take 1 capsule (30 mg total) by mouth daily. 30 capsule 1   glipiZIDE (GLUCOTROL XL) 5 MG 24 hr tablet Take 1 tablet (5 mg total) by mouth daily with breakfast. 30 tablet 1   Glucose Blood (BLOOD GLUCOSE TEST STRIPS) STRP 1 each by In Vitro route in the morning, at noon, and at bedtime. May substitute to any manufacturer covered by patient's insurance. 100 strip 0   hydrocortisone (ANUSOL-HC) 2.5 % rectal cream Place 1 application rectally 2 (two) times daily. (Patient taking differently: Place 1 application  rectally 2 (two) times daily as needed for hemorrhoids or anal itching.) 30 g 0   lisinopril (ZESTRIL) 10 MG tablet TAKE (1) TABLET BY MOUTH ONCE DAILY. 90 tablet 0   rosuvastatin (CRESTOR) 10 MG tablet Take 1 tablet (10 mg total) by mouth daily. 90 tablet 1   Semaglutide,0.25 or 0.5MG /DOS, (OZEMPIC, 0.25 OR 0.5 MG/DOSE,) 2 MG/3ML SOPN Inject 0.25 mg into the skin every 7 (seven) days for 28 days, THEN 0.5 mg every 7 (seven) days. 3 mL 0   No facility-administered medications prior to visit.    Allergies  Allergen Reactions   Penicillins Hives    Has patient had a PCN reaction causing immediate rash, facial/tongue/throat  swelling, SOB or lightheadedness with hypotension:unsure Has patient had a PCN reaction causing severe rash involving mucus membranes or skin necrosis:unsure Has patient had a PCN reaction that required hospitalization:No Has patient had a PCN reaction occurring within the last 10 years:No If all of the above answers are "NO", then may proceed with Cephalosporin use. Childhood reaction    ROS Review of Systems  Constitutional:  Positive for fatigue. Negative for chills and fever.  HENT:  Negative for congestion, sinus pressure, sinus pain and sore throat.  Eyes:  Negative for pain and discharge.  Respiratory:  Negative for cough and shortness of breath.   Cardiovascular:  Negative for chest pain and palpitations.  Gastrointestinal:  Negative for abdominal pain, nausea and vomiting.  Endocrine: Negative for polydipsia and polyuria.  Genitourinary:  Negative for dysuria and hematuria.  Musculoskeletal:  Negative for neck pain and neck stiffness.  Skin:  Negative for rash.  Neurological:  Negative for dizziness, syncope, weakness and headaches.  Psychiatric/Behavioral:  Positive for dysphoric mood. Negative for agitation and behavioral problems. The patient is nervous/anxious.       Objective:    Physical Exam Vitals reviewed.  Constitutional:      General: She is not in acute distress.    Appearance: She is obese. She is not diaphoretic.  HENT:     Head: Normocephalic and atraumatic.     Nose: Nose normal.     Mouth/Throat:     Mouth: Mucous membranes are moist.  Eyes:     General: No scleral icterus.    Extraocular Movements: Extraocular movements intact.  Cardiovascular:     Rate and Rhythm: Normal rate and regular rhythm.     Pulses: Normal pulses.     Heart sounds: Normal heart sounds. No murmur heard. Pulmonary:     Breath sounds: Normal breath sounds. No wheezing or rales.  Musculoskeletal:     Cervical back: Neck supple. No tenderness.     Right lower leg: No  edema.     Left lower leg: No edema.  Skin:    General: Skin is warm.     Findings: No rash.  Neurological:     General: No focal deficit present.     Mental Status: She is alert and oriented to person, place, and time.     Sensory: No sensory deficit.     Motor: No weakness.  Psychiatric:        Mood and Affect: Mood is depressed.        Behavior: Behavior normal.     BP 124/84 (BP Location: Right Arm, Patient Position: Sitting, Cuff Size: Normal)   Pulse 84   Ht 5\' 2"  (1.575 m)   Wt 173 lb (78.5 kg)   SpO2 98%   BMI 31.64 kg/m  Wt Readings from Last 3 Encounters:  09/05/22 173 lb (78.5 kg)  07/14/22 177 lb 9.6 oz (80.6 kg)  11/22/21 175 lb 6.4 oz (79.6 kg)    Lab Results  Component Value Date   TSH 1.540 07/14/2022   Lab Results  Component Value Date   WBC 7.7 07/14/2022   HGB 12.8 07/14/2022   HCT 38.3 07/14/2022   MCV 93 07/14/2022   PLT 320 07/14/2022   Lab Results  Component Value Date   NA 139 07/14/2022   K 4.7 07/14/2022   CO2 21 07/14/2022   GLUCOSE 199 (H) 07/14/2022   BUN 9 07/14/2022   CREATININE 0.61 07/14/2022   BILITOT <0.2 07/14/2022   ALKPHOS 66 07/14/2022   AST 37 07/14/2022   ALT 35 (H) 07/14/2022   PROT 6.4 07/14/2022   ALBUMIN 4.2 07/14/2022   CALCIUM 9.2 07/14/2022   ANIONGAP 9 04/05/2016   EGFR 119 07/14/2022   Lab Results  Component Value Date   CHOL 234 (H) 07/14/2022   Lab Results  Component Value Date   HDL 33 (L) 07/14/2022   Lab Results  Component Value Date   LDLCALC 159 (H) 07/14/2022   Lab Results  Component Value Date  TRIG 225 (H) 07/14/2022   Lab Results  Component Value Date   CHOLHDL 7.1 (H) 07/14/2022   Lab Results  Component Value Date   HGBA1C 7.8 (H) 07/14/2022      Assessment & Plan:   Problem List Items Addressed This Visit       Cardiovascular and Mediastinum   Hypertension - Primary    BP Readings from Last 1 Encounters:  09/05/22 124/84  Usually well-controlled with  Lisinopril Counseled for compliance with the medications Advised DASH diet and moderate exercise/walking, at least 150 mins/week        Endocrine   Diabetes mellitus (HCC)    Lab Results  Component Value Date   HGBA1C 7.8 (H) 07/14/2022   Uncontrolled due to noncompliance Restart Glipizide 5 mg QD Added Ozempic, increase dose as tolerated - has not started it yet Glucose meter and supplies prescription sent  Had started Metformin, but had diarrhea with it Advised to follow diabetic diet On ACEi and statin F/u CMP and lipid panel Diabetic eye exam: Advised to follow up with Ophthalmology for diabetic eye exam        Other   Anxiety    Some of her symptoms are likely related to anxiety Needs to start taking Cymbalta considering her chronic fatigue Vistaril as needed for anxiety      Relevant Medications   hydrOXYzine (VISTARIL) 25 MG capsule   Adjustment disorder with mixed anxiety and depressed mood    Due to loss of her mother recently Needs to take Cymbalta regularly for GAD/MDD Vistaril as needed for anxiety [facility] therapy, but she prefers to wait for now.       Meds ordered this encounter  Medications   hydrOXYzine (VISTARIL) 25 MG capsule    Sig: Take 1 capsule (25 mg total) by mouth 2 (two) times daily as needed.    Dispense:  30 capsule    Refill:  0    Follow-up: Return in about 2 months (around 11/05/2022) for HTN and DM.    Anabel Halon, MD

## 2022-09-05 NOTE — Assessment & Plan Note (Addendum)
Some of her symptoms are likely related to anxiety Needs to start taking Cymbalta considering her chronic fatigue Vistaril as needed for anxiety

## 2022-09-05 NOTE — Assessment & Plan Note (Addendum)
Due to loss of her mother recently Needs to take Cymbalta regularly for GAD/MDD Vistaril as needed for anxiety [facility] therapy, but she prefers to wait for now.

## 2022-09-05 NOTE — Patient Instructions (Signed)
Please take Hydroxyzine as needed for anxiety.  Please start taking Cymbalta as prescribed.  Please start taking Glipizide as prescribed.  Please continue to take medications as prescribed.  Please continue to follow low carb diet and perform moderate exercise/walking at least 150 mins/week.

## 2022-09-05 NOTE — Assessment & Plan Note (Signed)
BP Readings from Last 1 Encounters:  09/05/22 124/84   Usually well-controlled with Lisinopril Counseled for compliance with the medications Advised DASH diet and moderate exercise/walking, at least 150 mins/week

## 2022-10-26 ENCOUNTER — Other Ambulatory Visit: Payer: Self-pay | Admitting: Internal Medicine

## 2022-10-26 DIAGNOSIS — I10 Essential (primary) hypertension: Secondary | ICD-10-CM

## 2022-11-06 ENCOUNTER — Telehealth: Payer: Self-pay | Admitting: Internal Medicine

## 2022-11-06 ENCOUNTER — Other Ambulatory Visit: Payer: Self-pay

## 2022-11-06 DIAGNOSIS — I10 Essential (primary) hypertension: Secondary | ICD-10-CM

## 2022-11-06 MED ORDER — LISINOPRIL 10 MG PO TABS: ORAL_TABLET | ORAL | 0 refills | Status: DC

## 2022-11-06 NOTE — Telephone Encounter (Signed)
Med refill lisinopril (ZESTRIL) 10 MG tablet [[id number]]   [facility]

## 2022-11-06 NOTE — Telephone Encounter (Signed)
Refills sent to pharmacy. 

## 2022-11-07 ENCOUNTER — Ambulatory Visit: Payer: Medicaid Other | Admitting: Internal Medicine

## 2023-03-07 ENCOUNTER — Ambulatory Visit (INDEPENDENT_AMBULATORY_CARE_PROVIDER_SITE_OTHER): Payer: Medicaid Other | Admitting: Internal Medicine

## 2023-03-07 ENCOUNTER — Encounter: Payer: Self-pay | Admitting: Internal Medicine

## 2023-03-07 VITALS — BP 134/88 | HR 86 | Ht 62.0 in | Wt 176.6 lb

## 2023-03-07 DIAGNOSIS — E1169 Type 2 diabetes mellitus with other specified complication: Secondary | ICD-10-CM | POA: Diagnosis not present

## 2023-03-07 DIAGNOSIS — E782 Mixed hyperlipidemia: Secondary | ICD-10-CM

## 2023-03-07 DIAGNOSIS — Z2821 Immunization not carried out because of patient refusal: Secondary | ICD-10-CM

## 2023-03-07 DIAGNOSIS — R5382 Chronic fatigue, unspecified: Secondary | ICD-10-CM

## 2023-03-07 DIAGNOSIS — I10 Essential (primary) hypertension: Secondary | ICD-10-CM

## 2023-03-07 DIAGNOSIS — L304 Erythema intertrigo: Secondary | ICD-10-CM | POA: Insufficient documentation

## 2023-03-07 DIAGNOSIS — Z7984 Long term (current) use of oral hypoglycemic drugs: Secondary | ICD-10-CM

## 2023-03-07 MED ORDER — ROSUVASTATIN CALCIUM 10 MG PO TABS: 10.0000 mg | ORAL_TABLET | Freq: Every evening | ORAL | 1 refills | Status: DC

## 2023-03-07 MED ORDER — GLIPIZIDE ER 5 MG PO TB24: 5.0000 mg | ORAL_TABLET | Freq: Every day | ORAL | 1 refills | Status: DC

## 2023-03-07 MED ORDER — DULOXETINE HCL 30 MG PO CPEP: 30.0000 mg | ORAL_CAPSULE | Freq: Every day | ORAL | 3 refills | Status: DC

## 2023-03-07 MED ORDER — KETOCONAZOLE 2 % EX CREA: 1.0000 | TOPICAL_CREAM | Freq: Every day | CUTANEOUS | 0 refills | Status: AC

## 2023-03-07 MED ORDER — LISINOPRIL 10 MG PO TABS: 10.0000 mg | ORAL_TABLET | Freq: Every day | ORAL | 1 refills | Status: DC

## 2023-03-07 MED ORDER — OZEMPIC (0.25 OR 0.5 MG/DOSE) 2 MG/3ML ~~LOC~~ SOPN: 0.5000 mg | PEN_INJECTOR | SUBCUTANEOUS | 3 refills | Status: DC

## 2023-03-07 NOTE — Progress Notes (Addendum)
Established Patient Office Visit  Subjective:  Patient ID: Susan Valdez, female    DOB: [birth year]  Age: 37 y.o. MRN: [id number]  CC:  Chief Complaint  Patient presents with   Hypertension    Follow up     HPI Susan Valdez is a 37 y.o. female with past medical history of HTN, type 2 DM and HLD who presents for f/u of her chronic medical conditions.  Type II DM with HLD: She has stopped taking Ozempic due to insurance coverage gap.  She was placed on glipizide 5 mg QD, but has been taking it inconsistently due to lack of follow-up. She had diarrhea and abdominal pain with metformin. Her HbA1C was 7.8 in 03/24, higher than prior. She has stopped taking Crestor for HLD.  HTN: BP is wnl today. Takes lisinopril regularly. Patient denies headache, dizziness, chest pain, dyspnea or palpitations.  She continues to have mild fatigue. Of note, she has not been taking Cymbalta regularly. 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 She has had anhedonia and anger bursts at times. Denies any SI or HI.   Past Medical History:  Diagnosis Date   Chlamydia    Hypertension    Increased serum lipids    Pre-diabetes    Toxemia in pregnancy    resolved    Past Surgical History:  Procedure Laterality Date   CESAREAN SECTION     x1   COLONOSCOPY WITH PROPOFOL N/A 09/16/2021   Procedure: COLONOSCOPY WITH PROPOFOL;  Surgeon: Lanelle Bal, DO;  Location: [facility];  Service: Endoscopy;  Laterality: N/A;  9:45am   FIBULA FRACTURE SURGERY Right    LAPAROSCOPIC BILATERAL SALPINGECTOMY Bilateral 04/12/2016   Procedure: LAPAROSCOPIC BILATERAL SALPINGECTOMY;  Surgeon: Lazaro Arms, MD;  Location: [facility];  Service: Gynecology;  Laterality: Bilateral;   LYSIS OF ADHESION N/A 04/12/2016   Procedure: LYSIS OF ADHESION;  Surgeon: Lazaro Arms, MD;  Location: [facility];  Service: Gynecology;  Laterality: N/A;    Family History  Problem Relation Age of Onset    Hypertension Mother    Diabetes Mother    Gestational diabetes Other    Colon cancer Neg Hx    Colon polyps Neg Hx     Social History   Socioeconomic History   Marital status: Single    Spouse name: Not on file   Number of children: Not on file   Years of education: Not on file   Highest education level: 12th grade  Occupational History   Not on file  Tobacco Use   Smoking status: Former    Types: Cigarettes, E-cigarettes   Smokeless tobacco: Never   Tobacco comments:    Only vapes  Vaping Use   Vaping status: Every Day  Substance and Sexual Activity   Alcohol use: Yes    Comment: occas   Drug use: No   Sexual activity: Yes    Birth control/protection: Pill  Other Topics Concern   Not on file  Social History Narrative   Not on file   Social Determinants of Health   Financial Resource Strain: High Risk (03/06/2023)   Overall Financial Resource Strain (CARDIA)    Difficulty of Paying Living Expenses: Very hard  Food Insecurity: Food Insecurity Present (03/06/2023)   Hunger Vital Sign    Worried About Running Out of Food in the Last Year: Sometimes true    Ran Out of Food in the Last Year: Sometimes true  Transportation Needs: No Transportation Needs (03/06/2023)   PRAPARE - Administrator, Civil Service (Medical): No    Lack of Transportation (Non-Medical): No  Physical Activity: Insufficiently Active (03/06/2023)   Exercise Vital Sign    Days of Exercise per Week: 4 days    Minutes of Exercise per Session: 30 min  Stress: No Stress Concern Present (03/06/2023)   [employer] of Occupational Health - Occupational Stress Questionnaire    Feeling of Stress : Only a little  Social Connections: Moderately Integrated (03/06/2023)   Social Connection and Isolation Panel [NHANES]    Frequency of Communication with Friends and Family: More than three times a week    Frequency of Social Gatherings with Friends and Family: Once a week    Attends Religious  Services: More than 4 times per year    Active Member of [employer] or Organizations: No    Attends Engineer, structural: Not on file    Marital Status: Living with partner  Intimate Partner Violence: Not At Risk (03/08/2020)   Humiliation, Afraid, Rape, and Kick questionnaire    Fear of Current or Ex-Partner: No    Emotionally Abused: No    Physically Abused: No    Sexually Abused: No    Outpatient Medications Prior to Visit  Medication Sig Dispense Refill   Blood Glucose Monitoring Suppl DEVI 1 each by Does not apply route in the morning, at noon, and at bedtime. May substitute to any manufacturer covered by patient's insurance. 1 each 0   Glucose Blood (BLOOD GLUCOSE TEST STRIPS) STRP 1 each by In Vitro route in the morning, at noon, and at bedtime. May substitute to any manufacturer covered by patient's insurance. 100 strip 0   hydrocortisone (ANUSOL-HC) 2.5 % rectal cream Place 1 application rectally 2 (two) times daily. (Patient taking differently: Place 1 application  rectally 2 (two) times daily as needed for hemorrhoids or anal itching.) 30 g 0   hydrOXYzine (VISTARIL) 25 MG capsule Take 1 capsule (25 mg total) by mouth 2 (two) times daily as needed. 30 capsule 0   DULoxetine (CYMBALTA) 30 MG capsule Take 1 capsule (30 mg total) by mouth daily. 30 capsule 1   glipiZIDE (GLUCOTROL XL) 5 MG 24 hr tablet Take 1 tablet (5 mg total) by mouth daily with breakfast. 30 tablet 1   lisinopril (ZESTRIL) 10 MG tablet TAKE (1) TABLET BY MOUTH ONCE DAILY. 90 tablet 0   rosuvastatin (CRESTOR) 10 MG tablet Take 1 tablet (10 mg total) by mouth daily. 90 tablet 1   No facility-administered medications prior to visit.    Allergies  Allergen Reactions   Penicillins Hives    Has patient had a PCN reaction causing immediate rash, facial/tongue/throat swelling, SOB or lightheadedness with hypotension:unsure Has patient had a PCN reaction causing severe rash involving mucus membranes or skin  necrosis:unsure Has patient had a PCN reaction that required hospitalization:No Has patient had a PCN reaction occurring within the last 10 years:No If all of the above answers are "NO", then may proceed with Cephalosporin use. Childhood reaction    ROS Review of Systems  Constitutional:  Positive for fatigue. Negative for chills and fever.  HENT:  Negative for congestion, sinus pressure, sinus pain and sore throat.   Eyes:  Negative for pain and discharge.  Respiratory:  Negative for cough and shortness of breath.   Cardiovascular:  Negative for chest pain and palpitations.  Gastrointestinal:  Negative for abdominal pain, nausea and vomiting.  Endocrine: Negative for polydipsia and polyuria.  Genitourinary:  Negative for dysuria and hematuria.  Musculoskeletal:  Negative for neck pain and neck stiffness.  Skin:  Negative for rash.  Neurological:  Negative for dizziness, syncope, weakness and headaches.  Psychiatric/Behavioral:  Positive for dysphoric mood. Negative for agitation and behavioral problems. The patient is nervous/anxious.       Objective:    Physical Exam Vitals reviewed.  Constitutional:      General: She is not in acute distress.    Appearance: She is obese. She is not diaphoretic.  HENT:     Head: Normocephalic and atraumatic.     Nose: Nose normal.     Mouth/Throat:     Mouth: Mucous membranes are moist.  Eyes:     General: No scleral icterus.    Extraocular Movements: Extraocular movements intact.  Cardiovascular:     Rate and Rhythm: Normal rate and regular rhythm.     Heart sounds: Normal heart sounds. No murmur heard. Pulmonary:     Breath sounds: Normal breath sounds. No wheezing or rales.  Musculoskeletal:     Cervical back: Neck supple. No tenderness.     Right lower leg: No edema.     Left lower leg: No edema.  Skin:    General: Skin is warm.     Findings: Rash (Dark brownish with whitish patches in bilateral axilla) present.  Neurological:      General: No focal deficit present.     Mental Status: She is alert and oriented to person, place, and time.     Sensory: No sensory deficit.     Motor: No weakness.  Psychiatric:        Mood and Affect: Mood normal.        Behavior: Behavior normal.     BP 134/88 (BP Location: Left Arm)   Pulse 86   Ht 5\' 2"  (1.575 m)   Wt 176 lb 9.6 oz (80.1 kg)   SpO2 98%   BMI 32.30 kg/m  Wt Readings from Last 3 Encounters:  03/07/23 176 lb 9.6 oz (80.1 kg)  09/05/22 173 lb (78.5 kg)  07/14/22 177 lb 9.6 oz (80.6 kg)    Lab Results  Component Value Date   TSH 1.540 07/14/2022   Lab Results  Component Value Date   WBC 7.7 07/14/2022   HGB 12.8 07/14/2022   HCT 38.3 07/14/2022   MCV 93 07/14/2022   PLT 320 07/14/2022   Lab Results  Component Value Date   NA 139 07/14/2022   K 4.7 07/14/2022   CO2 21 07/14/2022   GLUCOSE 199 (H) 07/14/2022   BUN 9 07/14/2022   CREATININE 0.61 07/14/2022   BILITOT <0.2 07/14/2022   ALKPHOS 66 07/14/2022   AST 37 07/14/2022   ALT 35 (H) 07/14/2022   PROT 6.4 07/14/2022   ALBUMIN 4.2 07/14/2022   CALCIUM 9.2 07/14/2022   ANIONGAP 9 04/05/2016   EGFR 119 07/14/2022   Lab Results  Component Value Date   CHOL 234 (H) 07/14/2022   Lab Results  Component Value Date   HDL 33 (L) 07/14/2022   Lab Results  Component Value Date   LDLCALC 159 (H) 07/14/2022   Lab Results  Component Value Date   TRIG 225 (H) 07/14/2022   Lab Results  Component Value Date   CHOLHDL 7.1 (H) 07/14/2022   Lab Results  Component Value Date   HGBA1C 7.8 (H) 07/14/2022      Assessment & Plan:  Problem List Items Addressed This Visit       Cardiovascular and Mediastinum   Hypertension    BP Readings from Last 1 Encounters:  03/07/23 134/88   Usually well-controlled with Lisinopril Counseled for compliance with the medications Advised DASH diet and moderate exercise/walking, at least 150 mins/week      Relevant Medications   rosuvastatin  (CRESTOR) 10 MG tablet   lisinopril (ZESTRIL) 10 MG tablet     Endocrine   Diabetes mellitus (HCC) - Primary    Lab Results  Component Value Date   HGBA1C 7.8 (H) 07/14/2022    Uncontrolled due to noncompliance Restart Glipizide 5 mg QD Added Ozempic again, increase dose as tolerated - has not started it yet Glucose meter and supplies prescription sent  Had started Metformin, but had diarrhea with it Advised to follow diabetic diet On ACEi and statin F/u CMP and lipid panel Diabetic eye exam: Advised to follow up with Ophthalmology for diabetic eye exam       Relevant Medications   Semaglutide,0.25 or 0.5MG /DOS, (OZEMPIC, 0.25 OR 0.5 MG/DOSE,) 2 MG/3ML SOPN   rosuvastatin (CRESTOR) 10 MG tablet   lisinopril (ZESTRIL) 10 MG tablet   glipiZIDE (GLUCOTROL XL) 5 MG 24 hr tablet   Other Relevant Orders   CMP14+EGFR   Hemoglobin A1c     Musculoskeletal and Integument   Intertrigo    Bilateral rash with itching likely intertrigo Ketoconazole cream prescribed      Relevant Medications   ketoconazole (NIZORAL) 2 % cream     Other   Mixed hyperlipidemia    On Crestor, needs to start taking it again      Relevant Medications   rosuvastatin (CRESTOR) 10 MG tablet   lisinopril (ZESTRIL) 10 MG tablet   Other Relevant Orders   Lipid Profile   Fatigue    Chronic fatigue, could be multifactorial Had COVID infection, has had fatigue since then Had been changing jobs recently, which has been stressful for her Also reports mild anhedonia and anger bursts at times On Cymbalta, advised to take it regularly      Relevant Medications   DULoxetine (CYMBALTA) 30 MG capsule   Other Visit Diagnoses     Refused influenza vaccine          Meds ordered this encounter  Medications   Semaglutide,0.25 or 0.5MG /DOS, (OZEMPIC, 0.25 OR 0.5 MG/DOSE,) 2 MG/3ML SOPN    Sig: Inject 0.5 mg into the skin every 7 (seven) days.    Dispense:  3 mL    Refill:  3   rosuvastatin (CRESTOR) 10  MG tablet    Sig: Take 1 tablet (10 mg total) by mouth every evening.    Dispense:  90 tablet    Refill:  1   lisinopril (ZESTRIL) 10 MG tablet    Sig: Take 1 tablet (10 mg total) by mouth daily.    Dispense:  90 tablet    Refill:  1   glipiZIDE (GLUCOTROL XL) 5 MG 24 hr tablet    Sig: Take 1 tablet (5 mg total) by mouth daily with breakfast.    Dispense:  90 tablet    Refill:  1   DULoxetine (CYMBALTA) 30 MG capsule    Sig: Take 1 capsule (30 mg total) by mouth daily.    Dispense:  30 capsule    Refill:  3   ketoconazole (NIZORAL) 2 % cream    Sig: Apply 1 Application topically daily.    Dispense:  15 g  Refill:  0    Follow-up: Return in about 4 months (around 07/05/2023) for HTN and DM.    Anabel Halon, MD

## 2023-03-07 NOTE — Addendum Note (Signed)
Addended byTrena Platt on: 03/07/2023 03:45 PM   Modules accepted: Orders

## 2023-03-07 NOTE — Assessment & Plan Note (Signed)
Lab Results  Component Value Date   HGBA1C 7.8 (H) 07/14/2022    Uncontrolled due to noncompliance Restart Glipizide 5 mg QD Added Ozempic again, increase dose as tolerated - has not started it yet Glucose meter and supplies prescription sent  Had started Metformin, but had diarrhea with it Advised to follow diabetic diet On ACEi and statin F/u CMP and lipid panel Diabetic eye exam: Advised to follow up with Ophthalmology for diabetic eye exam

## 2023-03-07 NOTE — Assessment & Plan Note (Signed)
Bilateral rash with itching likely intertrigo Ketoconazole cream prescribed

## 2023-03-07 NOTE — Assessment & Plan Note (Addendum)
BP Readings from Last 1 Encounters:  03/07/23 134/88   Usually well-controlled with Lisinopril Counseled for compliance with the medications Advised DASH diet and moderate exercise/walking, at least 150 mins/week

## 2023-03-07 NOTE — Assessment & Plan Note (Signed)
On Crestor, needs to start taking it again

## 2023-03-07 NOTE — Assessment & Plan Note (Signed)
Chronic fatigue, could be multifactorial Had COVID infection, has had fatigue since then Had been changing jobs recently, which has been stressful for her Also reports mild anhedonia and anger bursts at times On Cymbalta, advised to take it regularly

## 2023-03-07 NOTE — Patient Instructions (Signed)
Please start taking Ozempic at 0.5 mg once weekly.  Please continue to take medications as prescribed.  Please continue to follow low carb diet and perform moderate exercise/walking at least 150 mins/week.

## 2023-03-08 LAB — CMP14+EGFR
ALT: 45 [IU]/L — ABNORMAL HIGH (ref 0–32)
AST: 75 [IU]/L — ABNORMAL HIGH (ref 0–40)
Albumin: 4.2 g/dL (ref 3.9–4.9)
Alkaline Phosphatase: 69 [IU]/L (ref 44–121)
BUN/Creatinine Ratio: 11 (ref 9–23)
BUN: 6 mg/dL (ref 6–20)
Bilirubin Total: 0.4 mg/dL (ref 0.0–1.2)
CO2: 21 mmol/L (ref 20–29)
Calcium: 9.7 mg/dL (ref 8.7–10.2)
Chloride: 100 mmol/L (ref 96–106)
Creatinine, Ser: 0.53 mg/dL — ABNORMAL LOW (ref 0.57–1.00)
Globulin, Total: 2.4 g/dL (ref 1.5–4.5)
Glucose: 177 mg/dL — ABNORMAL HIGH (ref 70–99)
Potassium: 4.5 mmol/L (ref 3.5–5.2)
Sodium: 137 mmol/L (ref 134–144)
Total Protein: 6.6 g/dL (ref 6.0–8.5)
eGFR: 124 mL/min/{1.73_m2} (ref >59)

## 2023-03-08 LAB — HEMOGLOBIN A1C
Est. average glucose Bld gHb Est-mCnc: 194 mg/dL
Hgb A1c MFr Bld: 8.4 % — ABNORMAL HIGH (ref 4.8–5.6)

## 2023-03-08 LAB — LIPID PANEL
Chol/HDL Ratio: 7.4 ratio — ABNORMAL HIGH (ref 0.0–4.4)
Cholesterol, Total: 288 mg/dL — ABNORMAL HIGH (ref 100–199)
HDL: 39 mg/dL — ABNORMAL LOW (ref >39)
LDL Chol Calc (NIH): 192 mg/dL — ABNORMAL HIGH (ref 0–99)
Triglycerides: 291 mg/dL — ABNORMAL HIGH (ref 0–149)
VLDL Cholesterol Cal: 57 mg/dL — ABNORMAL HIGH (ref 5–40)

## 2023-05-08 ENCOUNTER — Ambulatory Visit
Admission: EM | Admit: 2023-05-08 | Discharge: 2023-05-08 | Disposition: A | Discharge status: Home / Self Care | Payer: Medicaid Other | Attending: Nurse Practitioner | Admitting: Nurse Practitioner

## 2023-05-08 ENCOUNTER — Encounter: Payer: Self-pay | Admitting: Emergency Medicine

## 2023-05-08 ENCOUNTER — Other Ambulatory Visit: Payer: Self-pay

## 2023-05-08 DIAGNOSIS — Z1152 Encounter for screening for COVID-19: Secondary | ICD-10-CM | POA: Insufficient documentation

## 2023-05-08 DIAGNOSIS — J069 Acute upper respiratory infection, unspecified: Secondary | ICD-10-CM | POA: Insufficient documentation

## 2023-05-08 LAB — POCT RAPID STREP A (OFFICE): Rapid Strep A Screen: NEGATIVE

## 2023-05-08 MED ORDER — BENZONATATE 100 MG PO CAPS
100.0000 mg | ORAL_CAPSULE | Freq: Three times a day (TID) | ORAL | 0 refills | Status: DC | PRN
Start: 2023-05-08 — End: 2023-07-05

## 2023-05-08 NOTE — ED Provider Notes (Signed)
 [facility]    CSN: [id number] Arrival date & time: 05/08/23  1402      History   Chief Complaint Chief Complaint  Patient presents with   Sore Throat    HPI Susan Valdez is a 38 y.o. female.   Patient presents today with 3 to 4-day history of bodyaches and chills, dry deep cough, runny and stuffy nose, sore throat, diarrhea, decreased appetite, and fatigue.  She also reports multiple episodes of vomiting 2 days ago that is now improved.  Denies known fever, shortness of breath or chest pain, headache, ear pain, and nausea.  She has been able to eat and drink since the vomiting and keep food and fluids down.  Reports multiple coworkers are sick with similar symptoms.  Has taken over-the-counter cold and flu medicine without much benefit.    Past Medical History:  Diagnosis Date   Chlamydia    Hypertension    Increased serum lipids    Pre-diabetes    Toxemia in pregnancy    resolved    Patient Active Problem List   Diagnosis Date Noted   Intertrigo 03/07/2023   Adjustment disorder with mixed anxiety and depressed mood 09/05/2022   Diabetes mellitus (HCC) 08/16/2021   Snoring 08/15/2021   Encounter for general adult medical examination with abnormal findings 04/18/2021   Anxiety 01/24/2021   Fatigue 08/26/2020   Hemorrhoid 06/28/2020   Transaminitis 02/16/2020   Hypertension 01/26/2020   Mixed hyperlipidemia 01/26/2020   Vapes nicotine containing substance 01/26/2020    Past Surgical History:  Procedure Laterality Date   CESAREAN SECTION     x1   COLONOSCOPY WITH PROPOFOL  N/A 09/16/2021   Procedure: COLONOSCOPY WITH PROPOFOL ;  Surgeon: Cindie Carlin POUR, DO;  Location: [facility];  Service: Endoscopy;  Laterality: N/A;  9:45am   FIBULA FRACTURE SURGERY Right    LAPAROSCOPIC BILATERAL SALPINGECTOMY Bilateral 04/12/2016   Procedure: LAPAROSCOPIC BILATERAL SALPINGECTOMY;  Surgeon: Vonn VEAR Inch, MD;  Location: [facility];  Service: Gynecology;   Laterality: Bilateral;   LYSIS OF ADHESION N/A 04/12/2016   Procedure: LYSIS OF ADHESION;  Surgeon: Vonn VEAR Inch, MD;  Location: [facility];  Service: Gynecology;  Laterality: N/A;    OB History     Gravida  1   Para  1   Term      Preterm      AB      Living  1      SAB      IAB      Ectopic      Multiple      Live Births               Home Medications    Prior to Admission medications   Medication Sig Start Date End Date Taking? Authorizing Provider  benzonatate  (TESSALON ) 100 MG capsule Take 1 capsule (100 mg total) by mouth 3 (three) times daily as needed for cough. Do not take with alcohol or while driving or operating heavy machinery.  May cause drowsiness. 05/08/23  Yes Chandra Harlene LABOR, NP  Blood Glucose Monitoring Suppl DEVI 1 each by Does not apply route in the morning, at noon, and at bedtime. May substitute to any manufacturer covered by patient's insurance. 07/14/22   Tobie Suzzane POUR, MD  DULoxetine  (CYMBALTA ) 30 MG capsule Take 1 capsule (30 mg total) by mouth daily. Patient not taking: Reported on 05/08/2023 03/07/23   Tobie Suzzane POUR, MD  glipiZIDE  (GLUCOTROL  XL) 5 MG 24 hr  tablet Take 1 tablet (5 mg total) by mouth daily with breakfast. 03/07/23   Tobie Suzzane POUR, MD  Glucose Blood (BLOOD GLUCOSE TEST STRIPS) STRP 1 each by In Vitro route in the morning, at noon, and at bedtime. May substitute to any manufacturer covered by patient's insurance. 07/14/22   Tobie Suzzane POUR, MD  hydrocortisone  (ANUSOL -HC) 2.5 % rectal cream Place 1 application rectally 2 (two) times daily. Patient taking differently: Place 1 application  rectally 2 (two) times daily as needed for hemorrhoids or anal itching. 06/28/20   Elnor Fairy HERO, NP  hydrOXYzine  (VISTARIL ) 25 MG capsule Take 1 capsule (25 mg total) by mouth 2 (two) times daily as needed. 09/05/22   Tobie Suzzane POUR, MD  ketoconazole  (NIZORAL ) 2 % cream Apply 1 Application topically daily. 03/07/23   Tobie Suzzane POUR, MD   lisinopril  (ZESTRIL ) 10 MG tablet Take 1 tablet (10 mg total) by mouth daily. 03/07/23   Tobie Suzzane POUR, MD  rosuvastatin  (CRESTOR ) 10 MG tablet Take 1 tablet (10 mg total) by mouth every evening. 03/07/23   Tobie Suzzane POUR, MD  Semaglutide ,0.25 or 0.5MG /DOS, (OZEMPIC , 0.25 OR 0.5 MG/DOSE,) 2 MG/3ML SOPN Inject 0.5 mg into the skin every 7 (seven) days. 03/07/23   Tobie Suzzane POUR, MD    Family History Family History  Problem Relation Age of Onset   Hypertension Mother    Diabetes Mother    Gestational diabetes Other    Colon cancer Neg Hx    Colon polyps Neg Hx     Social History Social History   Tobacco Use   Smoking status: Former    Types: Cigarettes, E-cigarettes   Smokeless tobacco: Never   Tobacco comments:    Only vapes  Vaping Use   Vaping status: Every Day  Substance Use Topics   Alcohol use: Yes    Comment: occas   Drug use: No     Allergies   Penicillins   Review of Systems Review of Systems Per HPI  Physical Exam Triage Vital Signs ED Triage Vitals  Encounter Vitals Group     BP 05/08/23 1413 124/86     Systolic BP Percentile --      Diastolic BP Percentile --      Pulse Rate 05/08/23 1413 83     Resp 05/08/23 1413 20     Temp 05/08/23 1413 (!) 97.5 F (36.4 C)     Temp Source 05/08/23 1413 Oral     SpO2 05/08/23 1413 94 %     Weight --      Height --      Head Circumference --      Peak Flow --      Pain Score 05/08/23 1411 0     Pain Loc --      Pain Education --      Exclude from Growth Chart --    No data found.  Updated Vital Signs BP 124/86 (BP Location: Right Arm)   Pulse 83   Temp (!) 97.5 F (36.4 C) (Oral)   Resp 20   LMP 04/22/2023 (Approximate)   SpO2 94%   Visual Acuity Right Eye Distance:   Left Eye Distance:   Bilateral Distance:    Right Eye Near:   Left Eye Near:    Bilateral Near:     Physical Exam Vitals and nursing note reviewed.  Constitutional:      General: She is not in acute distress.     Appearance: Normal appearance. She  is not ill-appearing or toxic-appearing.  HENT:     Head: Normocephalic and atraumatic.     Right Ear: Tympanic membrane, ear canal and external ear normal. No drainage, swelling or tenderness. No middle ear effusion. Tympanic membrane is not erythematous.     Left Ear: Tympanic membrane, ear canal and external ear normal. No drainage, swelling or tenderness.  No middle ear effusion. Tympanic membrane is not erythematous.     Nose: No congestion or rhinorrhea.     Mouth/Throat:     Mouth: Mucous membranes are moist.     Pharynx: Oropharynx is clear. Posterior oropharyngeal erythema present. No oropharyngeal exudate.  Eyes:     General: No scleral icterus.    Extraocular Movements: Extraocular movements intact.  Cardiovascular:     Rate and Rhythm: Normal rate and regular rhythm.  Pulmonary:     Effort: Pulmonary effort is normal. No respiratory distress.     Breath sounds: Normal breath sounds. No wheezing, rhonchi or rales.  Musculoskeletal:     Cervical back: Normal range of motion and neck supple.  Lymphadenopathy:     Cervical: No cervical adenopathy.  Skin:    General: Skin is warm and dry.     Coloration: Skin is not jaundiced or pale.     Findings: No erythema or rash.  Neurological:     Mental Status: She is alert and oriented to person, place, and time.  Psychiatric:        Behavior: Behavior is cooperative.      UC Treatments / Results  Labs (all labs ordered are listed, but only abnormal results are displayed) Labs Reviewed  SARS CORONAVIRUS 2 (TAT 6-24 HRS)  POCT RAPID STREP A (OFFICE)    EKG   Radiology No results found.  Procedures Procedures (including critical care time)  Medications Ordered in UC Medications - No data to display  Initial Impression / Assessment and Plan / UC Course  I have reviewed the triage vital signs and the nursing notes.  Pertinent labs & imaging results that were available during my  care of the patient were reviewed by me and considered in my medical decision making (see chart for details).   Patient is well-appearing, normotensive, afebrile, not tachycardic, not tachypneic, oxygenating well on room air.    1. Viral URI with cough 2. Encounter for screening for COVID-19 Overall, vitals and exam are reassuring Rapid strep is negative Suspect viral etiology COVID-19 testing obtained, not antiviral candidate if positive Supportive care discussed with patient ER and return precautions discussed Work excuse provided  The patient was given the opportunity to ask questions.  All questions answered to their satisfaction.  The patient is in agreement to this plan.    Final Clinical Impressions(s) / UC Diagnoses   Final diagnoses:  Viral URI with cough  Encounter for screening for COVID-19     Discharge Instructions      You have a viral upper respiratory infection.  Symptoms should improve over the next week to 10 days.  If you develop chest pain or shortness of breath, go to the emergency room.  We have tested you today for COVID-19.  You will see the results in Mychart and we will contact you with positive results.    Some things that can make you feel better are: - Increased rest - Increasing fluid with water/sugar free electrolytes - Acetaminophen  and ibuprofen  as needed for fever/pain - Salt water gargling, chloraseptic spray and throat lozenges - OTC guaifenesin (Mucinex)  600 mg twice daily - Saline sinus flushes or a neti pot - Humidifying the air -Tessalon  Perles every 8 hours as needed for dry cough      ED Prescriptions     Medication Sig Dispense Auth. Provider   benzonatate  (TESSALON ) 100 MG capsule Take 1 capsule (100 mg total) by mouth 3 (three) times daily as needed for cough. Do not take with alcohol or while driving or operating heavy machinery.  May cause drowsiness. 20 capsule Chandra Harlene LABOR, NP      PDMP not reviewed this  encounter.   Chandra Harlene LABOR, NP 05/08/23 1447

## 2023-05-08 NOTE — Discharge Instructions (Signed)
 You have a viral upper respiratory infection.  Symptoms should improve over the next week to 10 days.  If you develop chest pain or shortness of breath, go to the emergency room.  We have tested you today for COVID-19.  You will see the results in Mychart and we will contact you with positive results.    Some things that can make you feel better are: - Increased rest - Increasing fluid with water/sugar free electrolytes - Acetaminophen  and ibuprofen  as needed for fever/pain - Salt water gargling, chloraseptic spray and throat lozenges - OTC guaifenesin (Mucinex) 600 mg twice daily - Saline sinus flushes or a neti pot - Humidifying the air -Tessalon  Perles every 8 hours as needed for dry cough

## 2023-05-08 NOTE — ED Triage Notes (Signed)
 Pt reports sore throat, emesis,decreased appetite, generalized body aches,chills since Saturday.

## 2023-05-09 LAB — SARS CORONAVIRUS 2 (TAT 6-24 HRS): SARS Coronavirus 2: NEGATIVE

## 2023-05-18 ENCOUNTER — Ambulatory Visit (INDEPENDENT_AMBULATORY_CARE_PROVIDER_SITE_OTHER): Payer: Medicaid Other | Admitting: Family Medicine

## 2023-05-18 VITALS — BP 135/85 | HR 62 | Ht 62.0 in | Wt 174.0 lb

## 2023-05-18 DIAGNOSIS — N760 Acute vaginitis: Secondary | ICD-10-CM

## 2023-05-18 DIAGNOSIS — R350 Frequency of micturition: Secondary | ICD-10-CM

## 2023-05-18 MED ORDER — SULFAMETHOXAZOLE-TRIMETHOPRIM 800-160 MG PO TABS: 1.0000 | ORAL_TABLET | Freq: Two times a day (BID) | ORAL | 0 refills | Status: AC

## 2023-05-18 NOTE — Assessment & Plan Note (Addendum)
The urinalysis is negative for leukocytes and nitrates; however, therapy will be initiated due to the patient's symptomatic presentation, and a urine culture will be obtained for confirmation.  Plan:   Initiate therapy with Bactrim (sulfamethoxazole/trimethoprim) 1 tablet twice daily for 5 days. Emphasize the importance of completing the full course of antibiotics as prescribed. Preventative Measures Discussed for UTIs:  Avoid Prolonged Urination: Do not hold urine for extended periods, as this can stretch the bladder and create an environment conducive to bacterial growth. Prompt Bladder Emptying: Empty the bladder as soon as you feel the urge to prevent bacterial buildup. Post-Intercourse Hygiene: Empty the bladder soon after intercourse to reduce the risk of infection. Shower Instead of Bath: Opt for showers rather than baths to minimize bacterial exposure. Proper Wiping Technique: Always wipe from front to back after urinating or bowel movements to prevent the transfer of bacteria from the anal region to the urethra or vagina. Hydration: Drink plenty of water regularly to help flush bacteria from the urinary system. The patient verbalized understanding of the treatment plan and preventative measures. Follow-up will be scheduled based on the culture results or if symptoms persist or worsen.

## 2023-05-18 NOTE — Assessment & Plan Note (Signed)
Pending NuSwab Results Nonpharmacologic interventions were discussed, including: Maintaining good hygiene by gently cleansing the external genital area with mild, unscented soap and water. Avoiding douching. Wearing breathable cotton underwear. Avoiding irritation by steering clear of potentially sensitizing products such as tampons, pads, bubble baths, sprays, and scented soaps. Practicing safe sexual hygiene.

## 2023-05-18 NOTE — Patient Instructions (Addendum)
I appreciate the opportunity to provide care to you today!    Follow up:  pcp  Please start taking Bactrim twice daily for five days for treatment of urinary tract infection  Please ensure you complete the full course of the antibiotics as prescribed.  To help prevent future urinary tract infections (UTIs), consider the following practices: Avoid Prolonged Urination: Do not hold urine for extended periods, as this can stretch the bladder and create an environment conducive to bacterial growth. Prompt Bladder Emptying: Empty your bladder as soon as you feel the urge. Post-Intercourse Hygiene: Empty your bladder soon after intercourse to reduce the risk of infection. Shower Instead of Bath: Opt for showers rather than baths to minimize bacterial exposure. Proper Wiping Technique: Wipe from front to back after urinating and bowel movements to prevent the transfer of bacteria from the anal region to the vagina and urethra. Hydration: Drink a full glass of water regularly to help flush bacteria from your system.    For managing vaginitis, nonpharmacological interventions can help alleviate symptoms and prevent recurrence. Here are some effective strategies:  Maintain Good Hygiene:Gently cleanse the external genital area with mild, unscented soap and water. Avoid douching, as it disrupts the natural balance of bacteria. Wear Breathable, Cotton Underwear:Choose loose-fitting, cotton underwear to allow airflow and reduce moisture buildup, which can help prevent bacterial and fungal growth. Avoid Irritants:Avoid scented products, including tampons, pads, bubble baths, sprays, and soaps, as they can irritate sensitive vaginal tissue. Practice Safe Sexual Hygiene:Use condoms to reduce the risk of infections that may lead to vaginitis. Urinating after intercourse can also help prevent bacterial transfer. Maintain a Balanced Diet:Probiotic-rich foods (like yogurt, kefir, and sauerkraut) support healthy  vaginal flora, which may help in preventing bacterial vaginosis and yeast infections. Limit Moisture in the Vaginal Area:Change out of wet swimsuits and exercise clothes as soon as possible to prevent moisture from promoting yeast and bacterial growth. Avoid Tight Clothing:Wearing tight pants and synthetic fabrics can trap moisture and heat, which may encourage bacterial and fungal overgrowth. Manage Blood Sugar Levels:For individuals with diabetes, keeping blood sugar levels stable may help reduce the risk of recurrent yeast infections, as high blood sugar can contribute to yeast overgrowth. Strengthen Immune Health:Staying well-hydrated, managing stress, and ensuring good rest can support immune function, which is beneficial for preventing infections. Warm Sitz Baths:A warm sitz bath (without added products) can help soothe irritation and relieve discomfort.    Please continue to a heart-healthy diet and increase your physical activities. Try to exercise for at least five days a week.    It was a pleasure to see you and I look forward to continuing to work together on your health and well-being. Please do not hesitate to call the office if you need care or have questions about your care.  In case of emergency, please visit the Emergency Department for urgent care, or contact our clinic at [phone] to schedule an appointment. We're here to help you!   Have a wonderful day and week. With Gratitude, Gilmore Laroche MSN, FNP-BC

## 2023-05-18 NOTE — Progress Notes (Signed)
Acute Office Visit  Subjective:    Patient ID: Susan Valdez, female    DOB: 10-09-1985, 38 y.o.   MRN: [id number]  Chief Complaint  Patient presents with   Vaginal Discharge    Patient complains of vaginal discharge and itching starting a week ago.     HPI The patient is in today with complaints of urinary urgency, frequency, and pain with urination, along with vaginal discharge, irritation, and itching. Symptoms have been ongoing for about a week, and she reports taking over-the-counter AZO with minimal relief. She denies fever, chills, or foul odor. Her last coital activity was three weeks ago, prior to symptom onset.   Past Medical History:  Diagnosis Date   Chlamydia    Hypertension    Increased serum lipids    Pre-diabetes    Toxemia in pregnancy    resolved    Past Surgical History:  Procedure Laterality Date   CESAREAN SECTION     x1   COLONOSCOPY WITH PROPOFOL N/A 09/16/2021   Procedure: COLONOSCOPY WITH PROPOFOL;  Surgeon: Lanelle Bal, DO;  Location: [facility];  Service: Endoscopy;  Laterality: N/A;  9:45am   FIBULA FRACTURE SURGERY Right    LAPAROSCOPIC BILATERAL SALPINGECTOMY Bilateral 04/12/2016   Procedure: LAPAROSCOPIC BILATERAL SALPINGECTOMY;  Surgeon: Lazaro Arms, MD;  Location: [facility];  Service: Gynecology;  Laterality: Bilateral;   LYSIS OF ADHESION N/A 04/12/2016   Procedure: LYSIS OF ADHESION;  Surgeon: Lazaro Arms, MD;  Location: [facility];  Service: Gynecology;  Laterality: N/A;    Family History  Problem Relation Age of Onset   Hypertension Mother    Diabetes Mother    Gestational diabetes Other    Colon cancer Neg Hx    Colon polyps Neg Hx     Social History   Socioeconomic History   Marital status: Single    Spouse name: Not on file   Number of children: Not on file   Years of education: Not on file   Highest education level: 12th grade  Occupational History   Not on file  Tobacco Use   Smoking status: Former    Types:  Cigarettes, E-cigarettes   Smokeless tobacco: Never   Tobacco comments:    Only vapes  Vaping Use   Vaping status: Every Day  Substance and Sexual Activity   Alcohol use: Yes    Comment: occas   Drug use: No   Sexual activity: Yes    Birth control/protection: Pill  Other Topics Concern   Not on file  Social History Narrative   Not on file   Social Drivers of Health   Financial Resource Strain: Low Risk  (05/18/2023)   Overall Financial Resource Strain (CARDIA)    Difficulty of Paying Living Expenses: Not hard at all  Recent Concern: Financial Resource Strain - High Risk (03/06/2023)   Overall Financial Resource Strain (CARDIA)    Difficulty of Paying Living Expenses: Very hard  Food Insecurity: No Food Insecurity (05/18/2023)   Hunger Vital Sign    Worried About Running Out of Food in the Last Year: Never true    Ran Out of Food in the Last Year: Never true  Recent Concern: Food Insecurity - Food Insecurity Present (03/06/2023)   Hunger Vital Sign    Worried About Running Out of Food in the Last Year: Sometimes true    Ran Out of Food in the Last Year: Sometimes true  Transportation Needs: No Transportation Needs (05/18/2023)   PRAPARE -  Administrator, Civil Service (Medical): No    Lack of Transportation (Non-Medical): No  Physical Activity: Sufficiently Active (05/18/2023)   Exercise Vital Sign    Days of Exercise per Week: 7 days    Minutes of Exercise per Session: 30 min  Recent Concern: Physical Activity - Insufficiently Active (03/06/2023)   Exercise Vital Sign    Days of Exercise per Week: 4 days    Minutes of Exercise per Session: 30 min  Stress: No Stress Concern Present (05/18/2023)   [employer] of Occupational Health - Occupational Stress Questionnaire    Feeling of Stress : Not at all  Social Connections: Moderately Integrated (05/18/2023)   Social Connection and Isolation Panel [NHANES]    Frequency of Communication with Friends and Family:  More than three times a week    Frequency of Social Gatherings with Friends and Family: Once a week    Attends Religious Services: 1 to 4 times per year    Active Member of [employer] or Organizations: No    Attends Engineer, structural: Not on file    Marital Status: Living with partner  Intimate Partner Violence: Not At Risk (03/08/2020)   Humiliation, Afraid, Rape, and Kick questionnaire    Fear of Current or Ex-Partner: No    Emotionally Abused: No    Physically Abused: No    Sexually Abused: No    Outpatient Medications Prior to Visit  Medication Sig Dispense Refill   Blood Glucose Monitoring Suppl DEVI 1 each by Does not apply route in the morning, at noon, and at bedtime. May substitute to any manufacturer covered by patient's insurance. 1 each 0   glipiZIDE (GLUCOTROL XL) 5 MG 24 hr tablet Take 1 tablet (5 mg total) by mouth daily with breakfast. 90 tablet 1   Glucose Blood (BLOOD GLUCOSE TEST STRIPS) STRP 1 each by In Vitro route in the morning, at noon, and at bedtime. May substitute to any manufacturer covered by patient's insurance. 100 strip 0   hydrocortisone (ANUSOL-HC) 2.5 % rectal cream Place 1 application rectally 2 (two) times daily. (Patient taking differently: Place 1 application  rectally 2 (two) times daily as needed for hemorrhoids or anal itching.) 30 g 0   hydrOXYzine (VISTARIL) 25 MG capsule Take 1 capsule (25 mg total) by mouth 2 (two) times daily as needed. 30 capsule 0   ketoconazole (NIZORAL) 2 % cream Apply 1 Application topically daily. 15 g 0   lisinopril (ZESTRIL) 10 MG tablet Take 1 tablet (10 mg total) by mouth daily. 90 tablet 1   rosuvastatin (CRESTOR) 10 MG tablet Take 1 tablet (10 mg total) by mouth every evening. 90 tablet 1   Semaglutide,0.25 or 0.5MG /DOS, (OZEMPIC, 0.25 OR 0.5 MG/DOSE,) 2 MG/3ML SOPN Inject 0.5 mg into the skin every 7 (seven) days. 3 mL 3   benzonatate (TESSALON) 100 MG capsule Take 1 capsule (100 mg total) by mouth 3 (three)  times daily as needed for cough. Do not take with alcohol or while driving or operating heavy machinery.  May cause drowsiness. (Patient not taking: Reported on 05/18/2023) 20 capsule 0   DULoxetine (CYMBALTA) 30 MG capsule Take 1 capsule (30 mg total) by mouth daily. (Patient not taking: Reported on 05/18/2023) 30 capsule 3   No facility-administered medications prior to visit.    Allergies  Allergen Reactions   Penicillins Hives    Has patient had a PCN reaction causing immediate rash, facial/tongue/throat swelling, SOB or lightheadedness with hypotension:unsure Has  patient had a PCN reaction causing severe rash involving mucus membranes or skin necrosis:unsure Has patient had a PCN reaction that required hospitalization:No Has patient had a PCN reaction occurring within the last 10 years:No If all of the above answers are "NO", then may proceed with Cephalosporin use. Childhood reaction    Review of Systems  Constitutional:  Negative for chills and fever.  Eyes:  Negative for visual disturbance.  Respiratory:  Negative for chest tightness and shortness of breath.   Genitourinary:  Positive for frequency, urgency and vaginal discharge.  Neurological:  Negative for dizziness and headaches.       Objective:    Physical Exam HENT:     Head: Normocephalic.     Mouth/Throat:     Mouth: Mucous membranes are moist.  Cardiovascular:     Rate and Rhythm: Normal rate.     Heart sounds: Normal heart sounds.  Pulmonary:     Effort: Pulmonary effort is normal.     Breath sounds: Normal breath sounds.  Abdominal:     Tenderness: There is abdominal tenderness in the suprapubic area. There is no right CVA tenderness or left CVA tenderness.  Neurological:     Mental Status: She is alert.     BP 135/85   Pulse 62   Ht 5\' 2"  (1.575 m)   Wt 174 lb (78.9 kg)   LMP 04/22/2023 (Approximate)   SpO2 98%   BMI 31.83 kg/m  Wt Readings from Last 3 Encounters:  05/18/23 174 lb (78.9 kg)   03/07/23 176 lb 9.6 oz (80.1 kg)  09/05/22 173 lb (78.5 kg)       Assessment & Plan:  Urinary frequency Assessment & Plan: The urinalysis is negative for leukocytes and nitrates; however, therapy will be initiated due to the patient's symptomatic presentation, and a urine culture will be obtained for confirmation.  Plan:   Initiate therapy with Bactrim (sulfamethoxazole/trimethoprim) 1 tablet twice daily for 5 days. Emphasize the importance of completing the full course of antibiotics as prescribed. Preventative Measures Discussed for UTIs:  Avoid Prolonged Urination: Do not hold urine for extended periods, as this can stretch the bladder and create an environment conducive to bacterial growth. Prompt Bladder Emptying: Empty the bladder as soon as you feel the urge to prevent bacterial buildup. Post-Intercourse Hygiene: Empty the bladder soon after intercourse to reduce the risk of infection. Shower Instead of Bath: Opt for showers rather than baths to minimize bacterial exposure. Proper Wiping Technique: Always wipe from front to back after urinating or bowel movements to prevent the transfer of bacteria from the anal region to the urethra or vagina. Hydration: Drink plenty of water regularly to help flush bacteria from the urinary system. The patient verbalized understanding of the treatment plan and preventative measures. Follow-up will be scheduled based on the culture results or if symptoms persist or worsen.   Orders: -     Urinalysis -     Sulfamethoxazole-Trimethoprim; Take 1 tablet by mouth 2 (two) times daily for 5 days.  Dispense: 10 tablet; Refill: 0 -     Urine Culture  Acute vaginitis Assessment & Plan: Pending NuSwab Results Nonpharmacologic interventions were discussed, including: Maintaining good hygiene by gently cleansing the external genital area with mild, unscented soap and water. Avoiding douching. Wearing breathable cotton underwear. Avoiding irritation  by steering clear of potentially sensitizing products such as tampons, pads, bubble baths, sprays, and scented soaps. Practicing safe sexual hygiene.   Orders: -  NuSwab Vaginitis Plus (VG+)   Note: This chart has been completed using Engineer, civil (consulting) software, and while attempts have been made to ensure accuracy, certain words and phrases may not be transcribed as intended.   Gilmore Laroche, FNP

## 2023-05-21 ENCOUNTER — Other Ambulatory Visit: Payer: Self-pay | Admitting: Family Medicine

## 2023-05-21 DIAGNOSIS — B379 Candidiasis, unspecified: Secondary | ICD-10-CM

## 2023-05-21 LAB — URINALYSIS
Bilirubin, UA: NEGATIVE
Leukocytes,UA: NEGATIVE
Nitrite, UA: NEGATIVE
RBC, UA: NEGATIVE
Specific Gravity, UA: 1.025 (ref 1.005–1.030)
Urobilinogen, Ur: 0.2 mg/dL (ref 0.2–1.0)
pH, UA: 5.5 (ref 5.0–7.5)

## 2023-05-21 MED ORDER — FLUCONAZOLE 150 MG PO TABS: 150.0000 mg | ORAL_TABLET | Freq: Once | ORAL | 0 refills | Status: AC

## 2023-05-21 NOTE — Progress Notes (Signed)
Please inform the patient that a prescription for Diflucan 150 mg to take once has been sent to treat her yeast infection. Her nuswab was positive for yeast infection.

## 2023-05-22 LAB — NUSWAB VAGINITIS PLUS (VG+)
Candida albicans, NAA: POSITIVE — AB
Candida glabrata, NAA: NEGATIVE

## 2023-07-05 ENCOUNTER — Ambulatory Visit (INDEPENDENT_AMBULATORY_CARE_PROVIDER_SITE_OTHER): Payer: Medicaid Other | Admitting: Internal Medicine

## 2023-07-05 ENCOUNTER — Encounter: Payer: Self-pay | Admitting: Internal Medicine

## 2023-07-05 VITALS — BP 138/88 | HR 92 | Ht 62.0 in | Wt 176.2 lb

## 2023-07-05 DIAGNOSIS — F419 Anxiety disorder, unspecified: Secondary | ICD-10-CM | POA: Diagnosis not present

## 2023-07-05 DIAGNOSIS — E782 Mixed hyperlipidemia: Secondary | ICD-10-CM

## 2023-07-05 DIAGNOSIS — E1169 Type 2 diabetes mellitus with other specified complication: Secondary | ICD-10-CM

## 2023-07-05 DIAGNOSIS — Z0001 Encounter for general adult medical examination with abnormal findings: Secondary | ICD-10-CM

## 2023-07-05 DIAGNOSIS — I10 Essential (primary) hypertension: Secondary | ICD-10-CM | POA: Diagnosis not present

## 2023-07-05 DIAGNOSIS — R110 Nausea: Secondary | ICD-10-CM

## 2023-07-05 DIAGNOSIS — Z7984 Long term (current) use of oral hypoglycemic drugs: Secondary | ICD-10-CM

## 2023-07-05 MED ORDER — LISINOPRIL 10 MG PO TABS: 10.0000 mg | ORAL_TABLET | Freq: Every day | ORAL | 1 refills | Status: DC

## 2023-07-05 MED ORDER — ROSUVASTATIN CALCIUM 10 MG PO TABS: 10.0000 mg | ORAL_TABLET | Freq: Every evening | ORAL | 1 refills | Status: DC

## 2023-07-05 MED ORDER — GLIPIZIDE ER 5 MG PO TB24: 5.0000 mg | ORAL_TABLET | Freq: Every day | ORAL | 1 refills | Status: AC

## 2023-07-05 MED ORDER — ONDANSETRON HCL 4 MG PO TABS: 4.0000 mg | ORAL_TABLET | Freq: Three times a day (TID) | ORAL | 0 refills | Status: DC | PRN

## 2023-07-05 NOTE — Patient Instructions (Signed)
 Please start taking Ozempic as discussed.  Please continue to take medications as prescribed.  Please continue to follow low carb diet and perform moderate exercise/walking at least 150 mins/week.  Please get fasting blood tests done before the next visit.

## 2023-07-05 NOTE — Assessment & Plan Note (Addendum)
 BP Readings from Last 1 Encounters:  07/05/23 138/88   Usually well-controlled with Lisinopril Counseled for compliance with the medications Advised DASH diet and moderate exercise/walking, at least 150 mins/week

## 2023-07-05 NOTE — Progress Notes (Signed)
 Established Patient Office Visit  Subjective:  Patient ID: Susan Valdez, female    DOB: 02-16-1986  Age: 38 y.o. MRN: [id number]  CC:  Chief Complaint  Patient presents with   Diabetes    Four month follow up    Hypertension    Four month follow up    Annual Exam    HPI Susan Valdez is a 38 y.o. female with past medical history of HTN, type 2 DM and HLD who presents for annual physical.  Type II DM with HLD: She has not started taking Ozempic yet. She was placed on glipizide 5 mg QD, but compliance is questionable.  She had diarrhea and abdominal pain with metformin. Her HbA1C was 8.4 in 11/24, higher than prior. She has been taking Crestor for HLD.  HTN: BP is wnl today. Takes lisinopril regularly. Patient denies headache, dizziness, chest pain, dyspnea or palpitations.  She continues to have mild fatigue. Of note, she has not been taking Cymbalta regularly. 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 She has had anhedonia and anger bursts at times. Denies any SI or HI.   Past Medical History:  Diagnosis Date   Chlamydia    Hypertension    Increased serum lipids    Pre-diabetes    Toxemia in pregnancy    resolved    Past Surgical History:  Procedure Laterality Date   CESAREAN SECTION     x1   COLONOSCOPY WITH PROPOFOL N/A 09/16/2021   Procedure: COLONOSCOPY WITH PROPOFOL;  Surgeon: Lanelle Bal, DO;  Location: [facility];  Service: Endoscopy;  Laterality: N/A;  9:45am   FIBULA FRACTURE SURGERY Right    LAPAROSCOPIC BILATERAL SALPINGECTOMY Bilateral 04/12/2016   Procedure: LAPAROSCOPIC BILATERAL SALPINGECTOMY;  Surgeon: Lazaro Arms, MD;  Location: [facility];  Service: Gynecology;  Laterality: Bilateral;   LYSIS OF ADHESION N/A 04/12/2016   Procedure: LYSIS OF ADHESION;  Surgeon: Lazaro Arms, MD;  Location: [facility];  Service: Gynecology;  Laterality: N/A;    Family History  Problem Relation Age of Onset   Hypertension  Mother    Diabetes Mother    Gestational diabetes Other    Colon cancer Neg Hx    Colon polyps Neg Hx     Social History   Socioeconomic History   Marital status: Single    Spouse name: Not on file   Number of children: Not on file   Years of education: Not on file   Highest education level: 12th grade  Occupational History   Not on file  Tobacco Use   Smoking status: Former    Types: Cigarettes, E-cigarettes   Smokeless tobacco: Never   Tobacco comments:    Only vapes  Vaping Use   Vaping status: Every Day  Substance and Sexual Activity   Alcohol use: Yes    Comment: occas   Drug use: No   Sexual activity: Yes    Birth control/protection: Pill  Other Topics Concern   Not on file  Social History Narrative   Not on file   Social Drivers of Health   Financial Resource Strain: Low Risk  (05/18/2023)   Overall Financial Resource Strain (CARDIA)    Difficulty of Paying Living Expenses: Not hard at all  Recent Concern: Financial Resource Strain - High Risk (03/06/2023)   Overall Financial Resource Strain (CARDIA)    Difficulty of Paying Living Expenses: Very hard  Food Insecurity: No Food Insecurity (05/18/2023)  Hunger Vital Sign    Worried About Running Out of Food in the Last Year: Never true    Ran Out of Food in the Last Year: Never true  Recent Concern: Food Insecurity - Food Insecurity Present (03/06/2023)   Hunger Vital Sign    Worried About Running Out of Food in the Last Year: Sometimes true    Ran Out of Food in the Last Year: Sometimes true  Transportation Needs: No Transportation Needs (05/18/2023)   PRAPARE - Administrator, Civil Service (Medical): No    Lack of Transportation (Non-Medical): No  Physical Activity: Sufficiently Active (05/18/2023)   Exercise Vital Sign    Days of Exercise per Week: 7 days    Minutes of Exercise per Session: 30 min  Recent Concern: Physical Activity - Insufficiently Active (03/06/2023)   Exercise Vital Sign     Days of Exercise per Week: 4 days    Minutes of Exercise per Session: 30 min  Stress: No Stress Concern Present (05/18/2023)   [employer] of Occupational Health - Occupational Stress Questionnaire    Feeling of Stress : Not at all  Social Connections: Moderately Integrated (05/18/2023)   Social Connection and Isolation Panel [NHANES]    Frequency of Communication with Friends and Family: More than three times a week    Frequency of Social Gatherings with Friends and Family: Once a week    Attends Religious Services: 1 to 4 times per year    Active Member of [employer] or Organizations: No    Attends Engineer, structural: Not on file    Marital Status: Living with partner  Intimate Partner Violence: Not At Risk (03/08/2020)   Humiliation, Afraid, Rape, and Kick questionnaire    Fear of Current or Ex-Partner: No    Emotionally Abused: No    Physically Abused: No    Sexually Abused: No    Outpatient Medications Prior to Visit  Medication Sig Dispense Refill   Blood Glucose Monitoring Suppl DEVI 1 each by Does not apply route in the morning, at noon, and at bedtime. May substitute to any manufacturer covered by patient's insurance. 1 each 0   DULoxetine (CYMBALTA) 30 MG capsule Take 1 capsule (30 mg total) by mouth daily. (Patient not taking: Reported on 05/18/2023) 30 capsule 3   Glucose Blood (BLOOD GLUCOSE TEST STRIPS) STRP 1 each by In Vitro route in the morning, at noon, and at bedtime. May substitute to any manufacturer covered by patient's insurance. 100 strip 0   hydrocortisone (ANUSOL-HC) 2.5 % rectal cream Place 1 application rectally 2 (two) times daily. (Patient taking differently: Place 1 application  rectally 2 (two) times daily as needed for hemorrhoids or anal itching.) 30 g 0   hydrOXYzine (VISTARIL) 25 MG capsule Take 1 capsule (25 mg total) by mouth 2 (two) times daily as needed. 30 capsule 0   ketoconazole (NIZORAL) 2 % cream Apply 1 Application topically daily. 15  g 0   Semaglutide,0.25 or 0.5MG /DOS, (OZEMPIC, 0.25 OR 0.5 MG/DOSE,) 2 MG/3ML SOPN Inject 0.5 mg into the skin every 7 (seven) days. 3 mL 3   benzonatate (TESSALON) 100 MG capsule Take 1 capsule (100 mg total) by mouth 3 (three) times daily as needed for cough. Do not take with alcohol or while driving or operating heavy machinery.  May cause drowsiness. (Patient not taking: Reported on 05/18/2023) 20 capsule 0   glipiZIDE (GLUCOTROL XL) 5 MG 24 hr tablet Take 1 tablet (5 mg total) by  mouth daily with breakfast. 90 tablet 1   lisinopril (ZESTRIL) 10 MG tablet Take 1 tablet (10 mg total) by mouth daily. 90 tablet 1   rosuvastatin (CRESTOR) 10 MG tablet Take 1 tablet (10 mg total) by mouth every evening. 90 tablet 1   No facility-administered medications prior to visit.    Allergies  Allergen Reactions   Penicillins Hives    Has patient had a PCN reaction causing immediate rash, facial/tongue/throat swelling, SOB or lightheadedness with hypotension:unsure Has patient had a PCN reaction causing severe rash involving mucus membranes or skin necrosis:unsure Has patient had a PCN reaction that required hospitalization:No Has patient had a PCN reaction occurring within the last 10 years:No If all of the above answers are "NO", then may proceed with Cephalosporin use. Childhood reaction    ROS Review of Systems  Constitutional:  Positive for fatigue. Negative for chills and fever.  HENT:  Negative for congestion, sinus pressure, sinus pain and sore throat.   Eyes:  Negative for pain and discharge.  Respiratory:  Negative for cough and shortness of breath.   Cardiovascular:  Negative for chest pain and palpitations.  Gastrointestinal:  Positive for nausea. Negative for abdominal pain and vomiting.  Endocrine: Negative for polydipsia and polyuria.  Genitourinary:  Negative for dysuria and hematuria.  Musculoskeletal:  Negative for neck pain and neck stiffness.  Skin:  Negative for rash.   Neurological:  Negative for dizziness, syncope, weakness and headaches.  Psychiatric/Behavioral:  Positive for dysphoric mood. Negative for agitation and behavioral problems. The patient is nervous/anxious.       Objective:    Physical Exam Vitals reviewed.  Constitutional:      General: She is not in acute distress.    Appearance: She is obese. She is not diaphoretic.  HENT:     Head: Normocephalic and atraumatic.     Nose: Nose normal.     Mouth/Throat:     Mouth: Mucous membranes are moist.  Eyes:     General: No scleral icterus.    Extraocular Movements: Extraocular movements intact.  Cardiovascular:     Rate and Rhythm: Normal rate and regular rhythm.     Heart sounds: Normal heart sounds. No murmur heard. Pulmonary:     Breath sounds: Normal breath sounds. No wheezing or rales.  Abdominal:     Palpations: Abdomen is soft.     Tenderness: There is no abdominal tenderness.  Musculoskeletal:     Cervical back: Neck supple. No tenderness.     Right lower leg: No edema.     Left lower leg: No edema.  Skin:    General: Skin is warm.     Findings: Rash (Dark brownish with whitish patches in bilateral axilla) present.  Neurological:     General: No focal deficit present.     Mental Status: She is alert and oriented to person, place, and time.     Cranial Nerves: No cranial nerve deficit.     Sensory: No sensory deficit.     Motor: No weakness.  Psychiatric:        Mood and Affect: Mood normal.        Behavior: Behavior normal.     BP 138/88 (BP Location: Left Arm)   Pulse 92   Ht 5\' 2"  (1.575 m)   Wt 176 lb 3.2 oz (79.9 kg)   SpO2 98%   BMI 32.23 kg/m  Wt Readings from Last 3 Encounters:  07/05/23 176 lb 3.2 oz (79.9 kg)  05/18/23 174 lb (78.9 kg)  03/07/23 176 lb 9.6 oz (80.1 kg)    Lab Results  Component Value Date   TSH 1.540 07/14/2022   Lab Results  Component Value Date   WBC 7.7 07/14/2022   HGB 12.8 07/14/2022   HCT 38.3 07/14/2022   MCV 93  07/14/2022   PLT 320 07/14/2022   Lab Results  Component Value Date   NA 137 03/07/2023   K 4.5 03/07/2023   CO2 21 03/07/2023   GLUCOSE 177 (H) 03/07/2023   BUN 6 03/07/2023   CREATININE 0.53 (L) 03/07/2023   BILITOT 0.4 03/07/2023   ALKPHOS 69 03/07/2023   AST 75 (H) 03/07/2023   ALT 45 (H) 03/07/2023   PROT 6.6 03/07/2023   ALBUMIN 4.2 03/07/2023   CALCIUM 9.7 03/07/2023   ANIONGAP 9 04/05/2016   EGFR 124 03/07/2023   Lab Results  Component Value Date   CHOL 288 (H) 03/07/2023   Lab Results  Component Value Date   HDL 39 (L) 03/07/2023   Lab Results  Component Value Date   LDLCALC 192 (H) 03/07/2023   Lab Results  Component Value Date   TRIG 291 (H) 03/07/2023   Lab Results  Component Value Date   CHOLHDL 7.4 (H) 03/07/2023   Lab Results  Component Value Date   HGBA1C 8.4 (H) 03/07/2023      Assessment & Plan:   Problem List Items Addressed This Visit       Cardiovascular and Mediastinum   Hypertension   BP Readings from Last 1 Encounters:  07/05/23 138/88   Usually well-controlled with Lisinopril Counseled for compliance with the medications Advised DASH diet and moderate exercise/walking, at least 150 mins/week      Relevant Medications   lisinopril (ZESTRIL) 10 MG tablet   rosuvastatin (CRESTOR) 10 MG tablet     Endocrine   Type 2 diabetes mellitus with other specified complication (HCC)   Lab Results  Component Value Date   HGBA1C 8.4 (H) 03/07/2023    Uncontrolled due to noncompliance Associated with HTN and HLD Continue glipizide 5 mg QD Added Ozempic again, increase dose as tolerated - has not started it yet Glucose meter and supplies prescription sent  Had started Metformin, but had diarrhea with it Advised to follow diabetic diet On ACEi and statin F/u CMP and lipid panel Diabetic eye exam: Advised to follow up with Ophthalmology for diabetic eye exam       Relevant Medications   lisinopril (ZESTRIL) 10 MG tablet    glipiZIDE (GLUCOTROL XL) 5 MG 24 hr tablet   rosuvastatin (CRESTOR) 10 MG tablet   Other Relevant Orders   CMP14+EGFR   Hemoglobin A1c     Other   Mixed hyperlipidemia   On Crestor, needs to be compliant      Relevant Medications   lisinopril (ZESTRIL) 10 MG tablet   rosuvastatin (CRESTOR) 10 MG tablet   Other Relevant Orders   Lipid Profile   Anxiety   Some of her symptoms are likely related to anxiety Needs to start taking Cymbalta considering her chronic fatigue Vistaril as needed for anxiety      Encounter for general adult medical examination with abnormal findings - Primary   Physical exam as documented. Counseling done  re healthy lifestyle involving commitment to 150 minutes exercise per week, heart healthy diet, and attaining healthy weight.The importance of adequate sleep also discussed. Changes in health habits are decided on by the patient with goals and time frames  set for achieving them. Immunization and cancer screening needs are specifically addressed at this visit.      Other Visit Diagnoses       Nausea       Relevant Medications   ondansetron (ZOFRAN) 4 MG tablet       Meds ordered this encounter  Medications   lisinopril (ZESTRIL) 10 MG tablet    Sig: Take 1 tablet (10 mg total) by mouth daily.    Dispense:  90 tablet    Refill:  1   glipiZIDE (GLUCOTROL XL) 5 MG 24 hr tablet    Sig: Take 1 tablet (5 mg total) by mouth daily with breakfast.    Dispense:  90 tablet    Refill:  1   rosuvastatin (CRESTOR) 10 MG tablet    Sig: Take 1 tablet (10 mg total) by mouth every evening.    Dispense:  90 tablet    Refill:  1   ondansetron (ZOFRAN) 4 MG tablet    Sig: Take 1 tablet (4 mg total) by mouth every 8 (eight) hours as needed for nausea or vomiting.    Dispense:  20 tablet    Refill:  0    Follow-up: Return in about 3 months (around 10/05/2023) for DM.    Anabel Halon, MD

## 2023-07-05 NOTE — Assessment & Plan Note (Signed)
Some of her symptoms are likely related to anxiety Needs to start taking Cymbalta considering her chronic fatigue Vistaril as needed for anxiety

## 2023-07-05 NOTE — Assessment & Plan Note (Addendum)
 Lab Results  Component Value Date   HGBA1C 8.4 (H) 03/07/2023    Uncontrolled due to noncompliance Associated with HTN and HLD Continue glipizide 5 mg QD Added Ozempic again, increase dose as tolerated - has not started it yet Glucose meter and supplies prescription sent  Had started Metformin, but had diarrhea with it Advised to follow diabetic diet On ACEi and statin F/u CMP and lipid panel Diabetic eye exam: Advised to follow up with Ophthalmology for diabetic eye exam

## 2023-07-06 NOTE — Assessment & Plan Note (Addendum)
On Crestor, needs to be compliant

## 2023-07-06 NOTE — Assessment & Plan Note (Signed)

## 2023-07-23 ENCOUNTER — Other Ambulatory Visit: Payer: Self-pay | Admitting: Internal Medicine

## 2023-07-23 ENCOUNTER — Encounter: Payer: Self-pay | Admitting: Internal Medicine

## 2023-07-23 DIAGNOSIS — E1169 Type 2 diabetes mellitus with other specified complication: Secondary | ICD-10-CM

## 2023-07-23 MED ORDER — OZEMPIC (0.25 OR 0.5 MG/DOSE) 2 MG/3ML ~~LOC~~ SOPN: 0.5000 mg | PEN_INJECTOR | SUBCUTANEOUS | 2 refills | Status: DC

## 2023-07-23 NOTE — Telephone Encounter (Signed)
 Message addressed.

## 2023-07-23 NOTE — Telephone Encounter (Unsigned)
 Copied from CRM [id number]. Topic: Clinical - Medication Question >> Jul 23, 2023  9:20 AM Alphonzo Lemmings O wrote: Reason for CRM:  patient is calling cause she is taking the dose of ozempic for 2 weeks and the shot didn't reset it self . Really dont know what to do . When my boyfriend sticks me it usually goes down but it didn't this time and I couldn't use it , dont know what to do . Missed my dose this week needed to speak with someone to see how to go about this  [phone]

## 2023-10-05 LAB — LIPID PANEL

## 2023-10-06 LAB — CMP14+EGFR
ALT: 18 IU/L (ref 0–32)
AST: 23 IU/L (ref 0–40)
Albumin: 4.5 g/dL (ref 3.9–4.9)
Alkaline Phosphatase: 69 IU/L (ref 44–121)
BUN/Creatinine Ratio: 14 (ref 9–23)
BUN: 7 mg/dL (ref 6–20)
Bilirubin Total: 0.4 mg/dL (ref 0.0–1.2)
CO2: 19 mmol/L — ABNORMAL LOW (ref 20–29)
Calcium: 9.6 mg/dL (ref 8.7–10.2)
Chloride: 103 mmol/L (ref 96–106)
Creatinine, Ser: 0.5 mg/dL — ABNORMAL LOW (ref 0.57–1.00)
Globulin, Total: 2.4 g/dL (ref 1.5–4.5)
Glucose: 127 mg/dL — ABNORMAL HIGH (ref 70–99)
Potassium: 4.8 mmol/L (ref 3.5–5.2)
Sodium: 138 mmol/L (ref 134–144)
Total Protein: 6.9 g/dL (ref 6.0–8.5)
eGFR: 125 mL/min/{1.73_m2} (ref >59)

## 2023-10-06 LAB — LIPID PANEL
Chol/HDL Ratio: 5.6 ratio — ABNORMAL HIGH (ref 0.0–4.4)
Cholesterol, Total: 191 mg/dL (ref 100–199)
HDL: 34 mg/dL — ABNORMAL LOW (ref >39)
LDL Chol Calc (NIH): 129 mg/dL — ABNORMAL HIGH (ref 0–99)
Triglycerides: 154 mg/dL — ABNORMAL HIGH (ref 0–149)
VLDL Cholesterol Cal: 28 mg/dL (ref 5–40)

## 2023-10-06 LAB — HEMOGLOBIN A1C
Est. average glucose Bld gHb Est-mCnc: 146 mg/dL
Hgb A1c MFr Bld: 6.7 % — ABNORMAL HIGH (ref 4.8–5.6)

## 2023-10-10 ENCOUNTER — Encounter: Payer: Self-pay | Admitting: Internal Medicine

## 2023-10-10 ENCOUNTER — Ambulatory Visit (INDEPENDENT_AMBULATORY_CARE_PROVIDER_SITE_OTHER): Admitting: Internal Medicine

## 2023-10-10 VITALS — BP 134/83 | HR 98 | Ht 63.0 in | Wt 163.0 lb

## 2023-10-10 DIAGNOSIS — F419 Anxiety disorder, unspecified: Secondary | ICD-10-CM | POA: Diagnosis not present

## 2023-10-10 DIAGNOSIS — I10 Essential (primary) hypertension: Secondary | ICD-10-CM

## 2023-10-10 DIAGNOSIS — E1169 Type 2 diabetes mellitus with other specified complication: Secondary | ICD-10-CM | POA: Diagnosis not present

## 2023-10-10 DIAGNOSIS — E782 Mixed hyperlipidemia: Secondary | ICD-10-CM | POA: Diagnosis not present

## 2023-10-10 DIAGNOSIS — Z7984 Long term (current) use of oral hypoglycemic drugs: Secondary | ICD-10-CM

## 2023-10-10 MED ORDER — ROSUVASTATIN CALCIUM 10 MG PO TABS: 10.0000 mg | ORAL_TABLET | Freq: Every evening | ORAL | 1 refills | Status: AC

## 2023-10-10 MED ORDER — SEMAGLUTIDE (1 MG/DOSE) 4 MG/3ML ~~LOC~~ SOPN: 1.0000 mg | PEN_INJECTOR | SUBCUTANEOUS | 3 refills | Status: AC

## 2023-10-10 MED ORDER — DULOXETINE HCL 20 MG PO CPEP
20.0000 mg | ORAL_CAPSULE | Freq: Every day | ORAL | 3 refills | Status: AC
Start: 2023-10-10 — End: ?

## 2023-10-10 NOTE — Assessment & Plan Note (Signed)
 BP Readings from Last 1 Encounters:  10/10/23 134/83   Usually well-controlled with Lisinopril  Counseled for compliance with the medications Advised DASH diet and moderate exercise/walking, at least 150 mins/week

## 2023-10-10 NOTE — Progress Notes (Signed)
 Established Patient Office Visit  Subjective:  Patient ID: Susan Valdez, female    DOB: Sep 30, 1985  Age: 38 y.o. MRN: [id number]  CC:  Chief Complaint  Patient presents with   Medical Management of Chronic Issues    3 month f/u    HPI Susan Valdez is a 38 y.o. female with past medical history of HTN, type 2 DM and HLD who presents for f/u of her chronic medical conditions.  Type II DM with HLD: She has started taking Ozempic  0.5 mg qw, has tolerated it well. She also takes glipizide  5 mg once daily. She had diarrhea and abdominal pain with metformin . Her HbA1C has improved to 6.7 now from 8.4 in 11/24. She has lost 13 lbs since starting Ozempic  in 03/25. She has been taking Crestor  for HLD now, LDL has improved to 129 from 192.  HTN: BP is wnl today. Takes lisinopril  regularly. Patient denies headache, dizziness, chest pain, dyspnea or palpitations.  She continues to have mild fatigue. Of note, she has not been taking Cymbalta  regularly as she has sleepiness with it. 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 She has had anhedonia and anger bursts at times. Denies any SI or HI.   Past Medical History:  Diagnosis Date   Chlamydia    Hypertension    Increased serum lipids    Pre-diabetes    Toxemia in pregnancy    resolved    Past Surgical History:  Procedure Laterality Date   CESAREAN SECTION     x1   COLONOSCOPY WITH PROPOFOL  N/A 09/16/2021   Procedure: COLONOSCOPY WITH PROPOFOL ;  Surgeon: Vinetta Greening, DO;  Location: [facility];  Service: Endoscopy;  Laterality: N/A;  9:45am   FIBULA FRACTURE SURGERY Right    LAPAROSCOPIC BILATERAL SALPINGECTOMY Bilateral 04/12/2016   Procedure: LAPAROSCOPIC BILATERAL SALPINGECTOMY;  Surgeon: Wendelyn Halter, MD;  Location: [facility];  Service: Gynecology;  Laterality: Bilateral;   LYSIS OF ADHESION N/A 04/12/2016   Procedure: LYSIS OF ADHESION;  Surgeon: Wendelyn Halter, MD;  Location: [facility];   Service: Gynecology;  Laterality: N/A;    Family History  Problem Relation Age of Onset   Hypertension Mother    Diabetes Mother    Gestational diabetes Other    Colon cancer Neg Hx    Colon polyps Neg Hx     Social History   Socioeconomic History   Marital status: Single    Spouse name: Not on file   Number of children: Not on file   Years of education: Not on file   Highest education level: 12th grade  Occupational History   Not on file  Tobacco Use   Smoking status: Former    Types: Cigarettes, E-cigarettes   Smokeless tobacco: Never   Tobacco comments:    Only vapes  Vaping Use   Vaping status: Every Day  Substance and Sexual Activity   Alcohol use: Yes    Comment: occas   Drug use: No   Sexual activity: Yes    Birth control/protection: Pill  Other Topics Concern   Not on file  Social History Narrative   Not on file   Social Drivers of Health   Financial Resource Strain: Low Risk  (05/18/2023)   Overall Financial Resource Strain (CARDIA)    Difficulty of Paying Living Expenses: Not hard at all  Recent Concern: Financial Resource Strain - High Risk (03/06/2023)   Overall Financial Resource Strain (CARDIA)  Difficulty of Paying Living Expenses: Very hard  Food Insecurity: No Food Insecurity (05/18/2023)   Hunger Vital Sign    Worried About Running Out of Food in the Last Year: Never true    Ran Out of Food in the Last Year: Never true  Recent Concern: Food Insecurity - Food Insecurity Present (03/06/2023)   Hunger Vital Sign    Worried About Running Out of Food in the Last Year: Sometimes true    Ran Out of Food in the Last Year: Sometimes true  Transportation Needs: No Transportation Needs (05/18/2023)   PRAPARE - Administrator, Civil Service (Medical): No    Lack of Transportation (Non-Medical): No  Physical Activity: Sufficiently Active (05/18/2023)   Exercise Vital Sign    Days of Exercise per Week: 7 days    Minutes of Exercise per  Session: 30 min  Recent Concern: Physical Activity - Insufficiently Active (03/06/2023)   Exercise Vital Sign    Days of Exercise per Week: 4 days    Minutes of Exercise per Session: 30 min  Stress: No Stress Concern Present (05/18/2023)   [employer] of Occupational Health - Occupational Stress Questionnaire    Feeling of Stress : Not at all  Social Connections: Moderately Integrated (05/18/2023)   Social Connection and Isolation Panel [NHANES]    Frequency of Communication with Friends and Family: More than three times a week    Frequency of Social Gatherings with Friends and Family: Once a week    Attends Religious Services: 1 to 4 times per year    Active Member of [employer] or Organizations: No    Attends Engineer, structural: Not on file    Marital Status: Living with partner  Intimate Partner Violence: Not At Risk (03/08/2020)   Humiliation, Afraid, Rape, and Kick questionnaire    Fear of Current or Ex-Partner: No    Emotionally Abused: No    Physically Abused: No    Sexually Abused: No    Outpatient Medications Prior to Visit  Medication Sig Dispense Refill   Blood Glucose Monitoring Suppl DEVI 1 each by Does not apply route in the morning, at noon, and at bedtime. May substitute to any manufacturer covered by patient's insurance. 1 each 0   glipiZIDE  (GLUCOTROL  XL) 5 MG 24 hr tablet Take 1 tablet (5 mg total) by mouth daily with breakfast. 90 tablet 1   Glucose Blood (BLOOD GLUCOSE TEST STRIPS) STRP 1 each by In Vitro route in the morning, at noon, and at bedtime. May substitute to any manufacturer covered by patient's insurance. 100 strip 0   hydrocortisone  (ANUSOL -HC) 2.5 % rectal cream Place 1 application rectally 2 (two) times daily. (Patient taking differently: Place 1 application  rectally 2 (two) times daily as needed for hemorrhoids or anal itching.) 30 g 0   hydrOXYzine  (VISTARIL ) 25 MG capsule Take 1 capsule (25 mg total) by mouth 2 (two) times daily as needed.  30 capsule 0   ketoconazole  (NIZORAL ) 2 % cream Apply 1 Application topically daily. 15 g 0   lisinopril  (ZESTRIL ) 10 MG tablet Take 1 tablet (10 mg total) by mouth daily. 90 tablet 1   ondansetron  (ZOFRAN ) 4 MG tablet Take 1 tablet (4 mg total) by mouth every 8 (eight) hours as needed for nausea or vomiting. 20 tablet 0   DULoxetine  (CYMBALTA ) 30 MG capsule Take 1 capsule (30 mg total) by mouth daily. 30 capsule 3   rosuvastatin  (CRESTOR ) 10 MG tablet Take 1 tablet (  10 mg total) by mouth every evening. 90 tablet 1   Semaglutide ,0.25 or 0.5MG /DOS, (OZEMPIC , 0.25 OR 0.5 MG/DOSE,) 2 MG/3ML SOPN Inject 0.5 mg into the skin every 7 (seven) days. 3 mL 2   No facility-administered medications prior to visit.    Allergies  Allergen Reactions   Penicillins Hives    Has patient had a PCN reaction causing immediate rash, facial/tongue/throat swelling, SOB or lightheadedness with hypotension:unsure Has patient had a PCN reaction causing severe rash involving mucus membranes or skin necrosis:unsure Has patient had a PCN reaction that required hospitalization:No Has patient had a PCN reaction occurring within the last 10 years:No If all of the above answers are NO, then may proceed with Cephalosporin use. Childhood reaction    ROS Review of Systems  Constitutional:  Positive for fatigue. Negative for chills and fever.  HENT:  Negative for congestion, sinus pressure, sinus pain and sore throat.   Eyes:  Negative for pain and discharge.  Respiratory:  Negative for cough and shortness of breath.   Cardiovascular:  Negative for chest pain and palpitations.  Gastrointestinal:  Positive for nausea. Negative for abdominal pain and vomiting.  Endocrine: Negative for polydipsia and polyuria.  Genitourinary:  Negative for dysuria and hematuria.  Musculoskeletal:  Negative for neck pain and neck stiffness.  Skin:  Negative for rash.  Neurological:  Negative for dizziness, syncope, weakness and headaches.   Psychiatric/Behavioral:  Negative for agitation and behavioral problems. The patient is nervous/anxious.       Objective:    Physical Exam Vitals reviewed.  Constitutional:      General: She is not in acute distress.    Appearance: She is obese. She is not diaphoretic.  HENT:     Head: Normocephalic and atraumatic.     Nose: Nose normal.     Mouth/Throat:     Mouth: Mucous membranes are moist.  Eyes:     General: No scleral icterus.    Extraocular Movements: Extraocular movements intact.  Cardiovascular:     Rate and Rhythm: Normal rate and regular rhythm.     Heart sounds: Normal heart sounds. No murmur heard. Pulmonary:     Breath sounds: Normal breath sounds. No wheezing or rales.  Musculoskeletal:     Cervical back: Neck supple. No tenderness.     Right lower leg: No edema.     Left lower leg: No edema.  Skin:    General: Skin is warm.     Findings: No rash.  Neurological:     General: No focal deficit present.     Mental Status: She is alert and oriented to person, place, and time.     Sensory: No sensory deficit.     Motor: No weakness.  Psychiatric:        Mood and Affect: Mood normal.        Behavior: Behavior normal.     BP 134/83   Pulse 98   Ht 5' 3 (1.6 m)   Wt 163 lb (73.9 kg)   SpO2 97%   BMI 28.87 kg/m  Wt Readings from Last 3 Encounters:  10/10/23 163 lb (73.9 kg)  07/05/23 176 lb 3.2 oz (79.9 kg)  05/18/23 174 lb (78.9 kg)    Lab Results  Component Value Date   TSH 1.540 07/14/2022   Lab Results  Component Value Date   WBC 7.7 07/14/2022   HGB 12.8 07/14/2022   HCT 38.3 07/14/2022   MCV 93 07/14/2022   PLT 320 07/14/2022  Lab Results  Component Value Date   NA 138 10/04/2023   K 4.8 10/04/2023   CO2 19 (L) 10/04/2023   GLUCOSE 127 (H) 10/04/2023   BUN 7 10/04/2023   CREATININE 0.50 (L) 10/04/2023   BILITOT 0.4 10/04/2023   ALKPHOS 69 10/04/2023   AST 23 10/04/2023   ALT 18 10/04/2023   PROT 6.9 10/04/2023   ALBUMIN  4.5 10/04/2023   CALCIUM  9.6 10/04/2023   ANIONGAP 9 04/05/2016   EGFR 125 10/04/2023   Lab Results  Component Value Date   CHOL 191 10/04/2023   Lab Results  Component Value Date   HDL 34 (L) 10/04/2023   Lab Results  Component Value Date   LDLCALC 129 (H) 10/04/2023   Lab Results  Component Value Date   TRIG 154 (H) 10/04/2023   Lab Results  Component Value Date   CHOLHDL 5.6 (H) 10/04/2023   Lab Results  Component Value Date   HGBA1C 6.7 (H) 10/04/2023      Assessment & Plan:   Problem List Items Addressed This Visit       Cardiovascular and Mediastinum   Hypertension   BP Readings from Last 1 Encounters:  10/10/23 134/83   Usually well-controlled with Lisinopril  Counseled for compliance with the medications Advised DASH diet and moderate exercise/walking, at least 150 mins/week      Relevant Medications   rosuvastatin  (CRESTOR ) 10 MG tablet     Endocrine   Type 2 diabetes mellitus with other specified complication (HCC) - Primary   Lab Results  Component Value Date   HGBA1C 6.7 (H) 10/04/2023   Well-controlled now Associated with HTN and HLD Continue glipizide  5 mg QD On Ozempic  0.5 mg qw, increase dose as tolerated - 1 mg qw now Glucose meter and supplies prescription sent  Had started Metformin , but had diarrhea with it Advised to follow diabetic diet On ACEi and statin F/u CMP and lipid panel Diabetic eye exam: Advised to follow up with Ophthalmology for diabetic eye exam       Relevant Medications   Semaglutide , 1 MG/DOSE, 4 MG/3ML SOPN   rosuvastatin  (CRESTOR ) 10 MG tablet   Other Relevant Orders   Microalbumin / creatinine urine ratio     Other   Mixed hyperlipidemia   On Crestor  10 mg QD, needs to be compliant      Relevant Medications   rosuvastatin  (CRESTOR ) 10 MG tablet   Anxiety   Some of her symptoms are likely related to anxiety Needs to start taking Cymbalta  considering her chronic fatigue, decreased dose to 20 mg QD  as she had sleepiness with 30 mg dose Vistaril  as needed for anxiety      Relevant Medications   DULoxetine  (CYMBALTA ) 20 MG capsule    Meds ordered this encounter  Medications   DULoxetine  (CYMBALTA ) 20 MG capsule    Sig: Take 1 capsule (20 mg total) by mouth daily.    Dispense:  30 capsule    Refill:  3   Semaglutide , 1 MG/DOSE, 4 MG/3ML SOPN    Sig: Inject 1 mg as directed once a week.    Dispense:  3 mL    Refill:  3   rosuvastatin  (CRESTOR ) 10 MG tablet    Sig: Take 1 tablet (10 mg total) by mouth every evening.    Dispense:  90 tablet    Refill:  1    Follow-up: Return in about 4 months (around 02/09/2024) for DM and HLD.    Jalyssa Fleisher K  Lydia Sams, MD

## 2023-10-10 NOTE — Assessment & Plan Note (Addendum)
 Some of her symptoms are likely related to anxiety Needs to start taking Cymbalta  considering her chronic fatigue, decreased dose to 20 mg QD as she had sleepiness with 30 mg dose Vistaril  as needed for anxiety

## 2023-10-10 NOTE — Patient Instructions (Signed)
 Please start taking Ozempic  1 mg once weekly instead of 0.5 mg.  Please start taking Cymbalta  20 mg instead of 30 mg once daily.  Please continue to take medications as prescribed.  Please continue to follow low carb diet and perform moderate exercise/walking at least 150 mins/week.

## 2023-10-10 NOTE — Assessment & Plan Note (Addendum)
 Lab Results  Component Value Date   HGBA1C 6.7 (H) 10/04/2023   Well-controlled now Associated with HTN and HLD Continue glipizide  5 mg QD On Ozempic  0.5 mg qw, increase dose as tolerated - 1 mg qw now Glucose meter and supplies prescription sent  Had started Metformin , but had diarrhea with it Advised to follow diabetic diet On ACEi and statin F/u CMP and lipid panel Diabetic eye exam: Advised to follow up with Ophthalmology for diabetic eye exam

## 2023-10-10 NOTE — Assessment & Plan Note (Signed)
 On Crestor  10 mg QD, needs to be compliant

## 2023-10-11 ENCOUNTER — Other Ambulatory Visit (HOSPITAL_COMMUNITY): Payer: Self-pay

## 2023-10-11 ENCOUNTER — Telehealth: Payer: Self-pay | Admitting: Pharmacy Technician

## 2023-10-11 NOTE — Telephone Encounter (Signed)
 Pharmacy Patient Advocate Encounter   Received notification from CoverMyMeds that prior authorization for Ozempic  (1 MG/DOSE) 4MG /3ML pen-injectors is required/requested.   Insurance verification completed.   The patient is insured through [facility] [place] .   Pt filled 0.25/0.5mg /dose on 09/26/2023. 75% of previous fill will need to be used before pt can fill the 1mg /dose.   No further PA needed at this time.

## 2023-10-13 LAB — MICROALBUMIN / CREATININE URINE RATIO
Creatinine, Urine: 172.1 mg/dL
Microalb/Creat Ratio: 6 mg/g{creat} (ref 0–29)
Microalbumin, Urine: 10.4 ug/mL

## 2023-12-04 ENCOUNTER — Other Ambulatory Visit: Payer: Self-pay | Admitting: Internal Medicine

## 2023-12-04 DIAGNOSIS — R110 Nausea: Secondary | ICD-10-CM

## 2023-12-05 NOTE — Telephone Encounter (Unsigned)
 Copied from CRM #[id number]. Topic: Appointments - Scheduling Inquiry for Clinic >> Dec 05, 2023 12:25 PM Antwanette L wrote: Reason for CRM: Pt has an ov on 9/4 at 10:40 am w/ Dr.Patel. Patient is dealing w/ a thick discharge and stomach pain and it could be a uti. The patient has been added to the wait list  and would like to come in as soon as possible. Pt is requesting a callback at [phone]

## 2024-01-03 ENCOUNTER — Ambulatory Visit: Payer: Self-pay | Admitting: Internal Medicine

## 2024-02-11 ENCOUNTER — Ambulatory Visit: Admitting: Internal Medicine

## 2024-02-22 ENCOUNTER — Other Ambulatory Visit: Payer: Self-pay | Admitting: Internal Medicine

## 2024-02-22 DIAGNOSIS — I10 Essential (primary) hypertension: Secondary | ICD-10-CM

## 2024-02-26 ENCOUNTER — Other Ambulatory Visit (HOSPITAL_COMMUNITY): Payer: Self-pay

## 2024-02-26 ENCOUNTER — Encounter: Payer: Self-pay | Admitting: Internal Medicine
